# Patient Record
Sex: Female | Born: 1990 | Hispanic: No | Marital: Married | Smoking: Never smoker
Health system: Southern US, Community
[De-identification: ages and names within clinical notes are randomized; demographics above are authoritative.]

## PROBLEM LIST (undated history)

## (undated) ENCOUNTER — Inpatient Hospital Stay (HOSPITAL_COMMUNITY): Payer: Self-pay

## (undated) DIAGNOSIS — N921 Excessive and frequent menstruation with irregular cycle: Secondary | ICD-10-CM

## (undated) DIAGNOSIS — N939 Abnormal uterine and vaginal bleeding, unspecified: Secondary | ICD-10-CM

## (undated) DIAGNOSIS — K219 Gastro-esophageal reflux disease without esophagitis: Secondary | ICD-10-CM

## (undated) DIAGNOSIS — Z975 Presence of (intrauterine) contraceptive device: Secondary | ICD-10-CM

## (undated) HISTORY — PX: NO PAST SURGERIES: SHX2092

## (undated) HISTORY — DX: Presence of (intrauterine) contraceptive device: Z97.5

## (undated) HISTORY — DX: Abnormal uterine and vaginal bleeding, unspecified: N93.9

## (undated) HISTORY — DX: Excessive and frequent menstruation with irregular cycle: N92.1

---

## 2012-01-04 NOTE — L&D Delivery Note (Signed)
Delivery Note At 10:30 PM a viable female was delivered via Vaginal, Spontaneous Delivery (Presentation: Right Occiput Anterior).  APGAR: 9, 9; weight 7 lb 1.2 oz (3209 g).   Placenta status: Intact, Spontaneous.  Cord: 3 vessels with the following complications: None.    Anesthesia: Local  Episiotomy: None Lacerations: Vaginal Suture Repair: 3.0 vicryl- 1 stitch for hemostasis  Est. Blood Loss (mL): 300  Mom to postpartum.  Baby to nursery-stable.  Pt c/c/+2 and pushed to deliver a liveborn female with spontaneous cry as above. Placenta delivered spontaneously intact with a 3 vessel cord. No complications. A vaginal laceration with a small amount of bleeding that required a stitch placement for hemostasis.  No other tears. Mom and baby doing well.   Shea Kapur L 09/12/2012, 10:58 PM

## 2012-01-17 ENCOUNTER — Encounter (HOSPITAL_COMMUNITY): Payer: Self-pay | Admitting: *Deleted

## 2012-01-17 ENCOUNTER — Emergency Department (HOSPITAL_COMMUNITY)
Admission: EM | Admit: 2012-01-17 | Discharge: 2012-01-17 | Disposition: A | Discharge status: Home / Self Care | Payer: Medicaid Other | Attending: Emergency Medicine | Admitting: Emergency Medicine

## 2012-01-17 ENCOUNTER — Emergency Department (HOSPITAL_COMMUNITY): Payer: Medicaid Other

## 2012-01-17 ENCOUNTER — Other Ambulatory Visit: Payer: Self-pay

## 2012-01-17 DIAGNOSIS — N760 Acute vaginitis: Secondary | ICD-10-CM | POA: Insufficient documentation

## 2012-01-17 DIAGNOSIS — O200 Threatened abortion: Secondary | ICD-10-CM | POA: Insufficient documentation

## 2012-01-17 DIAGNOSIS — B9689 Other specified bacterial agents as the cause of diseases classified elsewhere: Secondary | ICD-10-CM

## 2012-01-17 DIAGNOSIS — O9989 Other specified diseases and conditions complicating pregnancy, childbirth and the puerperium: Secondary | ICD-10-CM | POA: Insufficient documentation

## 2012-01-17 DIAGNOSIS — R509 Fever, unspecified: Secondary | ICD-10-CM | POA: Insufficient documentation

## 2012-01-17 DIAGNOSIS — O2390 Unspecified genitourinary tract infection in pregnancy, unspecified trimester: Secondary | ICD-10-CM | POA: Insufficient documentation

## 2012-01-17 DIAGNOSIS — IMO0001 Reserved for inherently not codable concepts without codable children: Secondary | ICD-10-CM | POA: Insufficient documentation

## 2012-01-17 LAB — URINE MICROSCOPIC-ADD ON

## 2012-01-17 LAB — CBC WITH DIFFERENTIAL/PLATELET
Basophils Absolute: 0 10*3/uL (ref 0.0–0.1)
Basophils Relative: 1 % (ref 0–1)
Eosinophils Relative: 0 % (ref 0–5)
HCT: 34.6 % — ABNORMAL LOW (ref 36.0–46.0)
Lymphocytes Relative: 32 % (ref 12–46)
MCHC: 34.1 g/dL (ref 30.0–36.0)
MCV: 78.1 fL (ref 78.0–100.0)
Monocytes Absolute: 0.4 10*3/uL (ref 0.1–1.0)
Platelets: 230 10*3/uL (ref 150–400)
RDW: 13.8 % (ref 11.5–15.5)
WBC: 4.8 10*3/uL (ref 4.0–10.5)

## 2012-01-17 LAB — POCT PREGNANCY, URINE: Preg Test, Ur: POSITIVE — AB

## 2012-01-17 LAB — URINALYSIS, ROUTINE W REFLEX MICROSCOPIC
Bilirubin Urine: NEGATIVE
Glucose, UA: NEGATIVE mg/dL
Specific Gravity, Urine: 1.022 (ref 1.005–1.030)
Urobilinogen, UA: 0.2 mg/dL (ref 0.0–1.0)
pH: 6 (ref 5.0–8.0)

## 2012-01-17 LAB — HCG, QUANTITATIVE, PREGNANCY: hCG, Beta Chain, Quant, S: 23100 m[IU]/mL — ABNORMAL HIGH (ref <5)

## 2012-01-17 LAB — COMPREHENSIVE METABOLIC PANEL
ALT: 12 U/L (ref 0–35)
AST: 18 U/L (ref 0–37)
Albumin: 4 g/dL (ref 3.5–5.2)
Calcium: 9.6 mg/dL (ref 8.4–10.5)
Creatinine, Ser: 0.5 mg/dL (ref 0.50–1.10)
Sodium: 135 mEq/L (ref 135–145)
Total Protein: 7.8 g/dL (ref 6.0–8.3)

## 2012-01-17 LAB — WET PREP, GENITAL
Trich, Wet Prep: NONE SEEN
Yeast Wet Prep HPF POC: NONE SEEN

## 2012-01-17 MED ORDER — METRONIDAZOLE 500 MG PO TABS: 500.0000 mg | ORAL_TABLET | Freq: Two times a day (BID) | ORAL | Status: DC

## 2012-01-17 MED ORDER — RHO D IMMUNE GLOBULIN 1500 UNIT/2ML IJ SOLN
300.0000 ug | Freq: Once | INTRAMUSCULAR | Status: AC
  Administered 2012-01-17: 300 ug via INTRAMUSCULAR

## 2012-01-17 MED ORDER — PRENATAL COMPLETE 14-0.4 MG PO TABS: 1.0000 | ORAL_TABLET | Freq: Every day | ORAL | Status: DC

## 2012-01-17 NOTE — ED Notes (Signed)
Pt speaks arabic and per interpreter phone pt is here with body aches, fever, and concerned about her heart.  Pt denies chest pain or shortness of breath.

## 2012-01-17 NOTE — ED Notes (Signed)
Pt to [place]. Communicated to [place] in [place] pt will need translator service for communication.

## 2012-01-17 NOTE — ED Provider Notes (Signed)
Medical screening examination/treatment/procedure(s) were performed by non-physician practitioner and as supervising physician I was immediately available for consultation/collaboration.    Sherese Heyward R Logann Whitebread, MD 01/17/12 1616 

## 2012-01-17 NOTE — ED Notes (Signed)
Pt resting watching tv and family at bedside doing same

## 2012-01-17 NOTE — ED Notes (Signed)
NAD noted at time of d/c home 

## 2012-01-17 NOTE — ED Notes (Signed)
Pt husband at bedside. Husband speaks little [place]. Interpretor phones in Arabic will be used.

## 2012-01-17 NOTE — ED Provider Notes (Addendum)
History     CSN: [id number]  Arrival date & time 01/17/12  0917   First MD Initiated Contact with Patient 01/17/12 1053      No chief complaint on file.   (Consider location/radiation/quality/duration/timing/severity/associated sxs/prior treatment) HPI  Brooke Perry is a 22 y.o. female complaining of 7 days of subjective fever. Denies pharyngitis, cough, rhinnorea, N/V/D, no urinary signs and symptoms or vaginal discharge. Patient endorses a severe myalgia and lower midline abdominal pain.  LMP 11/26 G1P0   History reviewed. No pertinent past medical history.  History reviewed. No pertinent past surgical history.  No family history on file.  History  Substance Use Topics  . Smoking status: Not on file  . Smokeless tobacco: Not on file  . Alcohol Use: Not on file    OB History    Grav Para Term Preterm Abortions TAB SAB Ect Mult Living                  Review of Systems  Constitutional: Positive for fever.  Respiratory: Negative for shortness of breath.   Cardiovascular: Negative for chest pain.  Gastrointestinal: Positive for abdominal pain. Negative for nausea, vomiting and diarrhea.  Musculoskeletal: Positive for myalgias.  All other systems reviewed and are negative.    Allergies  Review of patient's allergies indicates no known allergies.  Home Medications  No current outpatient prescriptions on file.  BP 115/58  Pulse 86  Temp 98.3 F (36.8 C) (Oral)  Resp 18  SpO2 100%  LMP 11/29/2011  Physical Exam  Nursing note and vitals reviewed. Constitutional: She is oriented to person, place, and time. She appears well-developed and well-nourished. No distress.  HENT:  Head: Normocephalic and atraumatic.  Mouth/Throat: Oropharynx is clear and moist.  Eyes: Conjunctivae normal and EOM are normal. Pupils are equal, round, and reactive to light.  Neck: Normal range of motion.  Cardiovascular: Normal rate, regular rhythm and intact distal pulses.     Pulmonary/Chest: Effort normal. No stridor.  Abdominal: Soft. Bowel sounds are normal. She exhibits no distension and no mass. There is tenderness. There is no rebound and no guarding.       Tenderness to deep palpation below the umbilicus  Genitourinary: Cervix exhibits no motion tenderness. Right adnexum displays no tenderness. Left adnexum displays no tenderness.       Pelvic exam chaperoned by technician. Dark blood pooled in the posterior fourchette.   No cervical motion or adnexal tenderness  Musculoskeletal: Normal range of motion.  Neurological: She is alert and oriented to person, place, and time.  Psychiatric: She has a normal mood and affect.    ED Course  Procedures (including critical care time)  Labs Reviewed  CBC WITH DIFFERENTIAL - Abnormal; Notable for the following:    Hemoglobin 11.8 (*)     HCT 34.6 (*)     All other components within normal limits  COMPREHENSIVE METABOLIC PANEL - Abnormal; Notable for the following:    Total Bilirubin 0.2 (*)     All other components within normal limits  URINALYSIS, ROUTINE W REFLEX MICROSCOPIC - Abnormal; Notable for the following:    Hgb urine dipstick TRACE (*)     Ketones, ur >80 (*)     All other components within normal limits  POCT PREGNANCY, URINE - Abnormal; Notable for the following:    Preg Test, Ur POSITIVE (*)     All other components within normal limits  URINE MICROSCOPIC-ADD ON - Abnormal; Notable for the following:  Squamous Epithelial / LPF FEW (*)     All other components within normal limits  HCG, QUANTITATIVE, PREGNANCY - Abnormal; Notable for the following:    hCG, Beta Chain, Quant, S 23100 (*)     All other components within normal limits  WET PREP, GENITAL - Abnormal; Notable for the following:    Clue Cells Wet Prep HPF POC FEW (*)     WBC, Wet Prep HPF POC FEW (*)     All other components within normal limits  INFLUENZA PANEL BY PCR  RH IG WORKUP (INCLUDES ABO/RH)  GC/CHLAMYDIA PROBE AMP    US Ob Comp Less 14 Wks  01/17/2012  *RADIOLOGY REPORT*  Clinical Data: abd pain vaginal bleed,bleeding; ;  OBSTETRIC <14 WK [place] AND TRANSVAGINAL OB US  Technique: Both transabdominal and transvaginal ultrasound examinations were performed for complete evaluation of the gestation as well as the maternal uterus, adnexal regions, and pelvic cul-de-sac.  Comparison: None.  Findings: There is a single intrauterine pregnancy.  Crown-rump length is 3.8 cm for an estimated gestational age of [redacted] weeks 0-day. Fetal heart tones not definitively documented at this time. Recommend follow-up.  No subchorionic hemorrhage.  Ovaries are symmetric in size and echotexture.  No adnexal masses. Trace free fluid adjacent to the left ovary.  IMPRESSION: 6-week-0-day intrauterine pregnancy.  No definite fetal heart tones currently (likely too early to visualize).  Recommend follow-up ultrasound in 10-14 days.   Original Report Authenticated By: Charlett Nose, M.D.    US Ob Transvaginal  01/17/2012  *RADIOLOGY REPORT*  Clinical Data: abd pain vaginal bleed,bleeding; ;  OBSTETRIC <14 WK [place] AND TRANSVAGINAL OB US  Technique: Both transabdominal and transvaginal ultrasound examinations were performed for complete evaluation of the gestation as well as the maternal uterus, adnexal regions, and pelvic cul-de-sac.  Comparison: None.  Findings: There is a single intrauterine pregnancy.  Crown-rump length is 3.8 cm for an estimated gestational age of [redacted] weeks 0-day. Fetal heart tones not definitively documented at this time. Recommend follow-up.  No subchorionic hemorrhage.  Ovaries are symmetric in size and echotexture.  No adnexal masses. Trace free fluid adjacent to the left ovary.  IMPRESSION: 6-week-0-day intrauterine pregnancy.  No definite fetal heart tones currently (likely too early to visualize).  Recommend follow-up ultrasound in 10-14 days.   Original Report Authenticated By: Charlett Nose, M.D.      1. Threatened abortion   2. BV  (bacterial vaginosis)       MDM   Patient is sudanese requires a sudanese dialect aerobic interpreter. Patient with abdominal pain, myalgia and fever for 7 days for newly diagnosed pregnancy. This is her first pregnancy. Pelvic exam shows blood in the posterior fourchette. She is Rh-, patient given 300 mcg of RhoGAM. Transvaginal ultrasound ordered to rule out ectopic pregnancy.  Case signed out to PA Muthersbaugh at shift change. Plan is to DC her to home with [facility] if TVUS is negative.       Wynetta Emery, PA-C 01/17/12 1554  Transvaginal ultrasound shows intrauterine pregnancy at 6 weeks. No fetal heart tones. Patient has clue cells on her wet prep. I will treat her for bacterial vaginosis.  [facility] for a OB/GYN exam.  New Prescriptions   METRONIDAZOLE (FLAGYL) 500 MG TABLET    Take 1 tablet (500 mg total) by mouth 2 (two) times daily.   PRENATAL VIT-FE FUMARATE-FA (PRENATAL COMPLETE) 14-0.4 MG  TABS    Take 1 tablet by mouth daily.      Wynetta Emery, PA-C 01/17/12 1619  Wynetta Emery, PA-C 01/18/12 [phone]

## 2012-01-18 ENCOUNTER — Telehealth: Payer: Self-pay | Admitting: Family Medicine

## 2012-01-18 DIAGNOSIS — O26859 Spotting complicating pregnancy, unspecified trimester: Secondary | ICD-10-CM

## 2012-01-18 LAB — RH IG WORKUP (INCLUDES ABO/RH): Gestational Age(Wks): 6

## 2012-01-19 NOTE — Telephone Encounter (Signed)
Orders only

## 2012-01-19 NOTE — ED Provider Notes (Signed)
Medical screening examination/treatment/procedure(s) were performed by non-physician practitioner and as supervising physician I was immediately available for consultation/collaboration.  Celene Kras, MD 01/19/12 1021

## 2012-01-31 ENCOUNTER — Ambulatory Visit (HOSPITAL_COMMUNITY)
Admission: RE | Admit: 2012-01-31 | Discharge: 2012-01-31 | Disposition: A | Discharge status: Home / Self Care | Payer: Medicaid Other | Source: Ambulatory Visit | Attending: Family Medicine | Admitting: Family Medicine

## 2012-01-31 DIAGNOSIS — O3680X0 Pregnancy with inconclusive fetal viability, not applicable or unspecified: Secondary | ICD-10-CM | POA: Insufficient documentation

## 2012-01-31 DIAGNOSIS — Z3689 Encounter for other specified antenatal screening: Secondary | ICD-10-CM | POA: Insufficient documentation

## 2012-01-31 DIAGNOSIS — O26859 Spotting complicating pregnancy, unspecified trimester: Secondary | ICD-10-CM

## 2012-02-29 ENCOUNTER — Other Ambulatory Visit (HOSPITAL_COMMUNITY)
Admission: RE | Admit: 2012-02-29 | Discharge: 2012-02-29 | Disposition: A | Discharge status: Home / Self Care | Payer: Medicaid Other | Source: Ambulatory Visit | Attending: Family Medicine | Admitting: Family Medicine

## 2012-02-29 ENCOUNTER — Other Ambulatory Visit: Payer: Self-pay | Admitting: Family Medicine

## 2012-02-29 ENCOUNTER — Encounter: Payer: Self-pay | Admitting: Family Medicine

## 2012-02-29 ENCOUNTER — Ambulatory Visit (INDEPENDENT_AMBULATORY_CARE_PROVIDER_SITE_OTHER): Payer: Medicaid Other | Admitting: Family Medicine

## 2012-02-29 VITALS — BP 127/72 | Ht 61.0 in | Wt 83.8 lb

## 2012-02-29 DIAGNOSIS — Z3682 Encounter for antenatal screening for nuchal translucency: Secondary | ICD-10-CM

## 2012-02-29 DIAGNOSIS — Z3400 Encounter for supervision of normal first pregnancy, unspecified trimester: Secondary | ICD-10-CM

## 2012-02-29 DIAGNOSIS — Z3402 Encounter for supervision of normal first pregnancy, second trimester: Secondary | ICD-10-CM | POA: Insufficient documentation

## 2012-02-29 DIAGNOSIS — Z01419 Encounter for gynecological examination (general) (routine) without abnormal findings: Secondary | ICD-10-CM | POA: Insufficient documentation

## 2012-02-29 DIAGNOSIS — Z3401 Encounter for supervision of normal first pregnancy, first trimester: Secondary | ICD-10-CM

## 2012-02-29 DIAGNOSIS — Z113 Encounter for screening for infections with a predominantly sexual mode of transmission: Secondary | ICD-10-CM | POA: Insufficient documentation

## 2012-02-29 LAB — HEPATITIS B SURFACE ANTIGEN: Hepatitis B Surface Ag: NEGATIVE

## 2012-02-29 LAB — POCT URINALYSIS DIP (DEVICE)
Glucose, UA: NEGATIVE mg/dL
Ketones, ur: NEGATIVE mg/dL
Leukocytes, UA: NEGATIVE
Protein, ur: 30 mg/dL — AB
Urobilinogen, UA: 0.2 mg/dL (ref 0.0–1.0)

## 2012-02-29 LAB — OB RESULTS CONSOLE RUBELLA ANTIBODY, IGM: Rubella: NON-IMMUNE/NOT IMMUNE

## 2012-02-29 MED ORDER — ONDANSETRON 4 MG PO TBDP: 4.0000 mg | ORAL_TABLET | Freq: Four times a day (QID) | ORAL | Status: DC | PRN

## 2012-02-29 NOTE — Patient Instructions (Signed)
Pregnancy - Second Trimester The second trimester of pregnancy (3 to 6 months) is a period of rapid growth for you and your baby. At the end of the sixth month, your baby is about 9 inches long and weighs 1 1/2 pounds. You will begin to feel the baby move between 18 and 20 weeks of the pregnancy. This is called quickening. Weight gain is faster. A clear fluid (colostrum) may leak out of your breasts. You may feel small contractions of the womb (uterus). This is known as false labor or Braxton-Hicks contractions. This is like a practice for labor when the baby is ready to be born. Usually, the problems with morning sickness have usually passed by the end of your first trimester. Some women develop small dark blotches (called cholasma, mask of pregnancy) on their face that usually goes away after the baby is born. Exposure to the sun makes the blotches worse. Acne may also develop in some pregnant women and pregnant women who have acne, may find that it goes away. PRENATAL EXAMS  Blood work may continue to be done during prenatal exams. These tests are done to check on your health and the probable health of your baby. Blood work is used to follow your blood levels (hemoglobin). Anemia (low hemoglobin) is common during pregnancy. Iron and vitamins are given to help prevent this. You will also be checked for diabetes between 24 and 28 weeks of the pregnancy. Some of the previous blood tests may be repeated.  The size of the uterus is measured during each visit. This is to make sure that the baby is continuing to grow properly according to the dates of the pregnancy.  Your blood pressure is checked every prenatal visit. This is to make sure you are not getting toxemia.  Your urine is checked to make sure you do not have an infection, diabetes or protein in the urine.  Your weight is checked often to make sure gains are happening at the suggested rate. This is to ensure that both you and your baby are  growing normally.  Sometimes, an ultrasound is performed to confirm the proper growth and development of the baby. This is a test which bounces harmless sound waves off the baby so your caregiver can more accurately determine due dates. Sometimes, a specialized test is done on the amniotic fluid surrounding the baby. This test is called an amniocentesis. The amniotic fluid is obtained by sticking a needle into the belly (abdomen). This is done to check the chromosomes in instances where there is a concern about possible genetic problems with the baby. It is also sometimes done near the end of pregnancy if an early delivery is required. In this case, it is done to help make sure the baby's lungs are mature enough for the baby to live outside of the womb. CHANGES OCCURING IN THE SECOND TRIMESTER OF PREGNANCY Your body goes through many changes during pregnancy. They vary from person to person. Talk to your caregiver about changes you notice that you are concerned about.  During the second trimester, you will likely have an increase in your appetite. It is normal to have cravings for certain foods. This varies from person to person and pregnancy to pregnancy.  Your lower abdomen will begin to bulge.  You may have to urinate more often because the uterus and baby are pressing on your bladder. It is also common to get more bladder infections during pregnancy (pain with urination). You can help this by   drinking lots of fluids and emptying your bladder before and after intercourse.  You may begin to get stretch marks on your hips, abdomen, and breasts. These are normal changes in the body during pregnancy. There are no exercises or medications to take that prevent this change.  You may begin to develop swollen and bulging veins (varicose veins) in your legs. Wearing support hose, elevating your feet for 15 minutes, 3 to 4 times a day and limiting salt in your diet helps lessen the problem.  Heartburn may  develop as the uterus grows and pushes up against the stomach. Antacids recommended by your caregiver helps with this problem. Also, eating smaller meals 4 to 5 times a day helps.  Constipation can be treated with a stool softener or adding bulk to your diet. Drinking lots of fluids, vegetables, fruits, and whole grains are helpful.  Exercising is also helpful. If you have been very active up until your pregnancy, most of these activities can be continued during your pregnancy. If you have been less active, it is helpful to start an exercise program such as walking.  Hemorrhoids (varicose veins in the rectum) may develop at the end of the second trimester. Warm sitz baths and hemorrhoid cream recommended by your caregiver helps hemorrhoid problems.  Backaches may develop during this time of your pregnancy. Avoid heavy lifting, wear low heal shoes and practice good posture to help with backache problems.  Some pregnant women develop tingling and numbness of their hand and fingers because of swelling and tightening of ligaments in the wrist (carpel tunnel syndrome). This goes away after the baby is born.  As your breasts enlarge, you may have to get a bigger bra. Get a comfortable, cotton, support bra. Do not get a nursing bra until the last month of the pregnancy if you will be nursing the baby.  You may get a dark line from your belly button to the pubic area called the linea nigra.  You may develop rosy cheeks because of increase blood flow to the face.  You may develop spider looking lines of the face, neck, arms and chest. These go away after the baby is born. HOME CARE INSTRUCTIONS   It is extremely important to avoid all smoking, herbs, alcohol, and unprescribed drugs during your pregnancy. These chemicals affect the formation and growth of the baby. Avoid these chemicals throughout the pregnancy to ensure the delivery of a healthy infant.  Most of your home care instructions are the same  as suggested for the first trimester of your pregnancy. Keep your caregiver's appointments. Follow your caregiver's instructions regarding medication use, exercise and diet.  During pregnancy, you are providing food for you and your baby. Continue to eat regular, well-balanced meals. Choose foods such as meat, fish, milk and other low fat dairy products, vegetables, fruits, and whole-grain breads and cereals. Your caregiver will tell you of the ideal weight gain.  A physical sexual relationship may be continued up until near the end of pregnancy if there are no other problems. Problems could include early (premature) leaking of amniotic fluid from the membranes, vaginal bleeding, abdominal pain, or other medical or pregnancy problems.  Exercise regularly if there are no restrictions. Check with your caregiver if you are unsure of the safety of some of your exercises. The greatest weight gain will occur in the last 2 trimesters of pregnancy. Exercise will help you:  Control your weight.  Get you in shape for labor and delivery.  Lose weight   after you have the baby.  Wear a good support or jogging bra for breast tenderness during pregnancy. This may help if worn during sleep. Pads or tissues may be used in the bra if you are leaking colostrum.  Do not use hot tubs, steam rooms or saunas throughout the pregnancy.  Wear your seat belt at all times when driving. This protects you and your baby if you are in an accident.  Avoid raw meat, uncooked cheese, cat litter boxes and soil used by cats. These carry germs that can cause birth defects in the baby.  The second trimester is also a good time to visit your dentist for your dental health if this has not been done yet. Getting your teeth cleaned is OK. Use a soft toothbrush. Brush gently during pregnancy.  It is easier to loose urine during pregnancy. Tightening up and strengthening the pelvic muscles will help with this problem. Practice stopping  your urination while you are going to the bathroom. These are the same muscles you need to strengthen. It is also the muscles you would use as if you were trying to stop from passing gas. You can practice tightening these muscles up 10 times a set and repeating this about 3 times per day. Once you know what muscles to tighten up, do not perform these exercises during urination. It is more likely to contribute to an infection by backing up the urine.  Ask for help if you have financial, counseling or nutritional needs during pregnancy. Your caregiver will be able to offer counseling for these needs as well as refer you for other special needs.  Your skin may become oily. If so, wash your face with mild soap, use non-greasy moisturizer and oil or cream based makeup. MEDICATIONS AND DRUG USE IN PREGNANCY  Take prenatal vitamins as directed. The vitamin should contain 1 milligram of folic acid. Keep all vitamins out of reach of children. Only a couple vitamins or tablets containing iron may be fatal to a baby or young child when ingested.  Avoid use of all medications, including herbs, over-the-counter medications, not prescribed or suggested by your caregiver. Only take over-the-counter or prescription medicines for pain, discomfort, or fever as directed by your caregiver. Do not use aspirin.  Let your caregiver also know about herbs you may be using.  Alcohol is related to a number of birth defects. This includes fetal alcohol syndrome. All alcohol, in any form, should be avoided completely. Smoking will cause low birth rate and premature babies.  Street or illegal drugs are very harmful to the baby. They are absolutely forbidden. A baby born to an addicted mother will be addicted at birth. The baby will go through the same withdrawal an adult does. SEEK MEDICAL CARE IF:  You have any concerns or worries during your pregnancy. It is better to call with your questions if you feel they cannot wait,  rather than worry about them. SEEK IMMEDIATE MEDICAL CARE IF:   An unexplained oral temperature above 102 F (38.9 C) develops, or as your caregiver suggests.  You have leaking of fluid from the vagina (birth canal). If leaking membranes are suspected, take your temperature and tell your caregiver of this when you call.  There is vaginal spotting, bleeding, or passing clots. Tell your caregiver of the amount and how many pads are used. Light spotting in pregnancy is common, especially following intercourse.  You develop a bad smelling vaginal discharge with a change in the color from clear   to white.  You continue to feel sick to your stomach (nauseated) and have no relief from remedies suggested. You vomit blood or coffee ground-like materials.  You lose more than 2 pounds of weight or gain more than 2 pounds of weight over 1 week, or as suggested by your caregiver.  You notice swelling of your face, hands, feet, or legs.  You get exposed to German measles and have never had them.  You are exposed to fifth disease or chickenpox.  You develop belly (abdominal) pain. Round ligament discomfort is a common non-cancerous (benign) cause of abdominal pain in pregnancy. Your caregiver still must evaluate you.  You develop a bad headache that does not go away.  You develop fever, diarrhea, pain with urination, or shortness of breath.  You develop visual problems, blurry, or double vision.  You fall or are in a car accident or any kind of trauma.  There is mental or physical violence at home. Document Released: 12/14/2000 Document Revised: 03/14/2011 Document Reviewed: 06/18/2008 [employer] Patient Information 2013 [employer].  Breastfeeding Deciding to breastfeed is one of the best choices you can make for you and your baby. The information that follows gives a brief overview of the benefits of breastfeeding as well as common topics surrounding breastfeeding. BENEFITS OF  BREASTFEEDING For the baby  The first milk (colostrum) helps the baby's digestive system function better.   There are antibodies in the mother's milk that help the baby fight off infections.   The baby has a lower incidence of asthma, allergies, and sudden infant death syndrome (SIDS).   The nutrients in breast milk are better for the baby than infant formulas, and breast milk helps the baby's brain grow better.   Babies who breastfeed have less gas, colic, and constipation.  For the mother  Breastfeeding helps develop a very special bond between the mother and her baby.   Breastfeeding is convenient, always available at the correct temperature, and costs nothing.   Breastfeeding burns calories in the mother and helps her lose weight that was gained during pregnancy.   Breastfeeding makes the uterus contract back down to normal size faster and slows bleeding following delivery.   Breastfeeding mothers have a lower risk of developing breast cancer.  BREASTFEEDING FREQUENCY  A healthy, full-term baby may breastfeed as often as every hour or space his or her feedings to every 3 hours.   Watch your baby for signs of hunger. Nurse your baby if he or she shows signs of hunger. How often you nurse will vary from baby to baby.   Nurse as often as the baby requests, or when you feel the need to reduce the fullness of your breasts.   Awaken the baby if it has been 3 4 hours since the last feeding.   Frequent feeding will help the mother make more milk and will help prevent problems, such as sore nipples and engorgement of the breasts.  BABY'S POSITION AT THE BREAST  Whether lying down or sitting, be sure that the baby's tummy is facing your tummy.   Support the breast with 4 fingers underneath the breast and the thumb above. Make sure your fingers are well away from the nipple and baby's mouth.   Stroke the baby's lips gently with your finger or nipple.   When the  baby's mouth is open wide enough, place all of your nipple and as much of the areola as possible into your baby's mouth.   Pull the baby in   close so the tip of the nose and the baby's cheeks touch the breast during the feeding.  FEEDINGS AND SUCTION  The length of each feeding varies from baby to baby and from feeding to feeding.   The baby must suck about 2 3 minutes for your milk to get to him or her. This is called a "let down." For this reason, allow the baby to feed on each breast as long as he or she wants. Your baby will end the feeding when he or she has received the right balance of nutrients.   To break the suction, put your finger into the corner of the baby's mouth and slide it between his or her gums before removing your breast from his or her mouth. This will help prevent sore nipples.  HOW TO TELL WHETHER YOUR BABY IS GETTING ENOUGH BREAST MILK. Wondering whether or not your baby is getting enough milk is a common concern among mothers. You can be assured that your baby is getting enough milk if:   Your baby is actively sucking and you hear swallowing.   Your baby seems relaxed and satisfied after a feeding.   Your baby nurses at least 8 12 times in a 24 hour time period. Nurse your baby until he or she unlatches or falls asleep at the first breast (at least 10 20 minutes), then offer the second side.   Your baby is wetting 5 6 disposable diapers (6 8 cloth diapers) in a 24 hour period by 5 6 days of age.   Your baby is having at least 3 4 stools every 24 hours for the first 6 weeks. The stool should be soft and yellow.   Your baby should gain 4 7 ounces per week after he or she is 4 days old.   Your breasts feel softer after nursing.  REDUCING BREAST ENGORGEMENT  In the first week after your baby is born, you may experience signs of breast engorgement. When breasts are engorged, they feel heavy, warm, full, and may be tender to the touch. You can reduce  engorgement if you:   Nurse frequently, every 2 3 hours. Mothers who breastfeed early and often have fewer problems with engorgement.   Place light ice packs on your breasts for 10 20 minutes between feedings. This reduces swelling. Wrap the ice packs in a lightweight towel to protect your skin. Bags of frozen vegetables work well for this purpose.   Take a warm shower or apply warm, moist heat to your breast for 5 10 minutes just before each feeding. This increases circulation and helps the milk flow.   Gently massage your breast before and during the feeding. Using your finger tips, massage from the chest wall towards your nipple in a circular motion.   Make sure that the baby empties at least one breast at every feeding before switching sides.   Use a breast pump to empty the breasts if your baby is sleepy or not nursing well. You may also want to pump if you are returning to work oryou feel you are getting engorged.   Avoid bottle feeds, pacifiers, or supplemental feedings of water or juice in place of breastfeeding. Breast milk is all the food your baby needs. It is not necessary for your baby to have water or formula. In fact, to help your breasts make more milk, it is best not to give your baby supplemental feedings during the early weeks.   Be sure the baby is latched   on and positioned properly while breastfeeding.   Wear a supportive bra, avoiding underwire styles.   Eat a balanced diet with enough fluids.   Rest often, relax, and take your prenatal vitamins to prevent fatigue, stress, and anemia.  If you follow these suggestions, your engorgement should improve in 24 48 hours. If you are still experiencing difficulty, call your lactation consultant or caregiver.  CARING FOR YOURSELF Take care of your breasts  Bathe or shower daily.   Avoid using soap on your nipples.   Start feedings on your left breast at one feeding and on your right breast at the next  feeding.   You will notice an increase in your milk supply 2 5 days after delivery. You may feel some discomfort from engorgement, which makes your breasts very firm and often tender. Engorgement "peaks" out within 24 48 hours. In the meantime, apply warm moist towels to your breasts for 5 10 minutes before feeding. Gentle massage and expression of some milk before feeding will soften your breasts, making it easier for your baby to latch on.   Wear a well-fitting nursing bra, and air dry your nipples for a 3 4minutes after each feeding.   Only use cotton bra pads.   Only use pure lanolin on your nipples after nursing. You do not need to wash it off before feeding the baby again. Another option is to express a few drops of breast milk and gently massage it into your nipples.  Take care of yourself  Eat well-balanced meals and nutritious snacks.   Drinking milk, fruit juice, and water to satisfy your thirst (about 8 glasses a day).   Get plenty of rest.  Avoid foods that you notice affect the baby in a bad way.  SEEK MEDICAL CARE IF:   You have difficulty with breastfeeding and need help.   You have a hard, red, sore area on your breast that is accompanied by a fever.   Your baby is too sleepy to eat well or is having trouble sleeping.   Your baby is wetting less than 6 diapers a day, by 5 days of age.   Your baby's skin or white part of his or her eyes is more yellow than it was in the hospital.   You feel depressed.  Document Released: 12/20/2004 Document Revised: 06/21/2011 Document Reviewed: 03/20/2011 [employer] Patient Information 2013 [employer].  

## 2012-02-29 NOTE — Progress Notes (Signed)
Subjective:    Brooke Perry is being seen today for her first obstetrical visit.  This is not a planned pregnancy. She is at [redacted]w[redacted]d gestation by 7w ultrasound (not c/w LMP). Her obstetrical history is significant for none. Relationship with FOB: spouse, living together. Patient does intend to breast feed. Pregnancy history fully reviewed. She and her husband moved here from [place] 4 months ago. Exam and interview conducted with Arabic translator in room.  Pt has episode of bleeding earlier in pregnancy which has resolved. She received a dose of Rhogam at that time. Her blood type is AB negative. She reports no bleeding, only mild lower abdominal cramping occasionally. No vaginal discharge. No history of STDs or HSV.   Also c/o nausea and vomiting.   Menstrual History: OB History   Grav Para Term Preterm Abortions TAB SAB Ect Mult Living   1               Patient's last menstrual period was 11/29/2011.   The following portions of the patient's history were reviewed and updated as appropriate: allergies, current medications, past family history, past medical history, past social history, past surgical history and problem list.  Review of Systems Constitutional: negative for chills, fevers and sweats Eyes: negative for color blindness and visual disturbance Respiratory: negative for asthma, cough, dyspnea on exertion and wheezing Cardiovascular: negative for chest pain, dyspnea, irregular heart beat and palpitations Gastrointestinal: negative for constipation and diarrhea Genitourinary:negative for dysuria Neurological: positive for headaches, negative for dizziness    Objective:    BP 127/72  Ht 5\' 1"  (1.549 m)  Wt 83 lb 12.8 oz (38.011 kg)  BMI 15.84 kg/m2  LMP 11/29/2011  General Appearance:    Alert, cooperative, no distress, appears stated age  Head:    Normocephalic, without obvious abnormality, atraumatic  Eyes:    Conjunctiva/corneas clear, EOM's intact  Neck:   Supple,  symmetrical, trachea midline, no adenopathy;    thyroid:  no enlargement/tenderness/nodules  Lungs:     Clear to auscultation bilaterally, respirations unlabored   Heart:    Regular rate and rhythm, S1 and S2 normal, no murmur, rub   or gallop  Breast Exam:    No tenderness, masses, or nipple abnormality  Abdomen:     Soft, non-tender, bowel sounds active, no guarding or rebound  Pelvic:   No lesions, no CMT, normal cervix, close/long. No adnexal tenderness. Normal vagina, moderate white discharge.  Extremities:   Extremities normal, atraumatic, no cyanosis or edema  Skin:   Skin color, texture, turgor normal, no rashes or lesions  Neurologic:   No focal deficits    Assessment:    Pregnancy at 12 and 0/7 weeks    Plan:    Initial labs drawn. Prenatal vitamins. Zofran for nausea. Problem list reviewed and updated. AFP3 discussed: requested. Referred to MFM for NT and 1st trimester screen. Role of ultrasound in pregnancy discussed; fetal survey: requested. Amniocentesis discussed: not indicated. Follow up in 4 weeks. 50% of 45 min visit spent on counseling and coordination of care.

## 2012-02-29 NOTE — Progress Notes (Signed)
Pulse 109 Has some pelvic pain at times

## 2012-03-02 LAB — HEMOGLOBINOPATHY EVALUATION
Hgb A: 97.1 % (ref 96.8–97.8)
Hgb F Quant: 0.3 % (ref 0.0–2.0)
Hgb S Quant: 0 %

## 2012-03-03 LAB — RUBELLA ANTIBODY, IGM: Rubella IgM: 0.37 (ref <0.90)

## 2012-03-04 ENCOUNTER — Encounter: Payer: Self-pay | Admitting: Family Medicine

## 2012-03-04 DIAGNOSIS — O9989 Other specified diseases and conditions complicating pregnancy, childbirth and the puerperium: Secondary | ICD-10-CM

## 2012-03-04 DIAGNOSIS — Z283 Underimmunization status: Secondary | ICD-10-CM | POA: Insufficient documentation

## 2012-03-05 ENCOUNTER — Encounter (HOSPITAL_COMMUNITY): Payer: Self-pay

## 2012-03-05 ENCOUNTER — Other Ambulatory Visit: Payer: Self-pay

## 2012-03-05 ENCOUNTER — Ambulatory Visit (HOSPITAL_COMMUNITY)
Admission: RE | Admit: 2012-03-05 | Discharge: 2012-03-05 | Disposition: A | Discharge status: Home / Self Care | Payer: Medicaid Other | Source: Ambulatory Visit | Attending: Family Medicine | Admitting: Family Medicine

## 2012-03-05 DIAGNOSIS — O3510X0 Maternal care for (suspected) chromosomal abnormality in fetus, unspecified, not applicable or unspecified: Secondary | ICD-10-CM | POA: Insufficient documentation

## 2012-03-05 DIAGNOSIS — Z3682 Encounter for antenatal screening for nuchal translucency: Secondary | ICD-10-CM

## 2012-03-05 DIAGNOSIS — Z3689 Encounter for other specified antenatal screening: Secondary | ICD-10-CM | POA: Insufficient documentation

## 2012-03-05 DIAGNOSIS — O351XX0 Maternal care for (suspected) chromosomal abnormality in fetus, not applicable or unspecified: Secondary | ICD-10-CM | POA: Insufficient documentation

## 2012-03-05 DIAGNOSIS — O360990 Maternal care for other rhesus isoimmunization, unspecified trimester, not applicable or unspecified: Secondary | ICD-10-CM | POA: Insufficient documentation

## 2012-03-07 ENCOUNTER — Encounter: Payer: Self-pay | Admitting: Family Medicine

## 2012-03-28 ENCOUNTER — Ambulatory Visit (INDEPENDENT_AMBULATORY_CARE_PROVIDER_SITE_OTHER): Payer: Medicaid Other | Admitting: Advanced Practice Midwife

## 2012-03-28 ENCOUNTER — Other Ambulatory Visit: Payer: Self-pay | Admitting: Advanced Practice Midwife

## 2012-03-28 ENCOUNTER — Encounter: Payer: Self-pay | Admitting: Advanced Practice Midwife

## 2012-03-28 VITALS — BP 107/72 | Temp 98.0°F | Wt 89.0 lb

## 2012-03-28 DIAGNOSIS — Z6791 Unspecified blood type, Rh negative: Secondary | ICD-10-CM | POA: Insufficient documentation

## 2012-03-28 DIAGNOSIS — Z3402 Encounter for supervision of normal first pregnancy, second trimester: Secondary | ICD-10-CM

## 2012-03-28 DIAGNOSIS — O360111 Maternal care for anti-D [Rh] antibodies, first trimester, fetus 1: Secondary | ICD-10-CM

## 2012-03-28 DIAGNOSIS — O9989 Other specified diseases and conditions complicating pregnancy, childbirth and the puerperium: Secondary | ICD-10-CM

## 2012-03-28 DIAGNOSIS — O360990 Maternal care for other rhesus isoimmunization, unspecified trimester, not applicable or unspecified: Secondary | ICD-10-CM

## 2012-03-28 DIAGNOSIS — Z2839 Other underimmunization status: Secondary | ICD-10-CM

## 2012-03-28 DIAGNOSIS — Z283 Underimmunization status: Secondary | ICD-10-CM

## 2012-03-28 LAB — POCT URINALYSIS DIP (DEVICE)
Hgb urine dipstick: NEGATIVE
Nitrite: NEGATIVE
pH: 6 (ref 5.0–8.0)

## 2012-03-28 NOTE — Patient Instructions (Addendum)
Pregnancy - Second Trimester The second trimester of pregnancy (3 to 6 months) is a period of rapid growth for you and your baby. At the end of the sixth month, your baby is about 9 inches long and weighs 1 1/2 pounds. You will begin to feel the baby move between 18 and 20 weeks of the pregnancy. This is called quickening. Weight gain is faster. A clear fluid (colostrum) may leak out of your breasts. You may feel small contractions of the womb (uterus). This is known as false labor or Braxton-Hicks contractions. This is like a practice for labor when the baby is ready to be born. Usually, the problems with morning sickness have usually passed by the end of your first trimester. Some women develop small dark blotches (called cholasma, mask of pregnancy) on their face that usually goes away after the baby is born. Exposure to the sun makes the blotches worse. Acne may also develop in some pregnant women and pregnant women who have acne, may find that it goes away. PRENATAL EXAMS  Blood work may continue to be done during prenatal exams. These tests are done to check on your health and the probable health of your baby. Blood work is used to follow your blood levels (hemoglobin). Anemia (low hemoglobin) is common during pregnancy. Iron and vitamins are given to help prevent this. You will also be checked for diabetes between 24 and 28 weeks of the pregnancy. Some of the previous blood tests may be repeated.  The size of the uterus is measured during each visit. This is to make sure that the baby is continuing to grow properly according to the dates of the pregnancy.  Your blood pressure is checked every prenatal visit. This is to make sure you are not getting toxemia.  Your urine is checked to make sure you do not have an infection, diabetes or protein in the urine.  Your weight is checked often to make sure gains are happening at the suggested rate. This is to ensure that both you and your baby are growing  normally.  Sometimes, an ultrasound is performed to confirm the proper growth and development of the baby. This is a test which bounces harmless sound waves off the baby so your caregiver can more accurately determine due dates. Sometimes, a specialized test is done on the amniotic fluid surrounding the baby. This test is called an amniocentesis. The amniotic fluid is obtained by sticking a needle into the belly (abdomen). This is done to check the chromosomes in instances where there is a concern about possible genetic problems with the baby. It is also sometimes done near the end of pregnancy if an early delivery is required. In this case, it is done to help make sure the baby's lungs are mature enough for the baby to live outside of the womb. CHANGES OCCURING IN THE SECOND TRIMESTER OF PREGNANCY Your body goes through many changes during pregnancy. They vary from person to person. Talk to your caregiver about changes you notice that you are concerned about.  During the second trimester, you will likely have an increase in your appetite. It is normal to have cravings for certain foods. This varies from person to person and pregnancy to pregnancy.  Your lower abdomen will begin to bulge.  You may have to urinate more often because the uterus and baby are pressing on your bladder. It is also common to get more bladder infections during pregnancy (pain with urination). You can help this by   drinking lots of fluids and emptying your bladder before and after intercourse.  You may begin to get stretch marks on your hips, abdomen, and breasts. These are normal changes in the body during pregnancy. There are no exercises or medications to take that prevent this change.  You may begin to develop swollen and bulging veins (varicose veins) in your legs. Wearing support hose, elevating your feet for 15 minutes, 3 to 4 times a day and limiting salt in your diet helps lessen the problem.  Heartburn may develop  as the uterus grows and pushes up against the stomach. Antacids recommended by your caregiver helps with this problem. Also, eating smaller meals 4 to 5 times a day helps.  Constipation can be treated with a stool softener or adding bulk to your diet. Drinking lots of fluids, vegetables, fruits, and whole grains are helpful.  Exercising is also helpful. If you have been very active up until your pregnancy, most of these activities can be continued during your pregnancy. If you have been less active, it is helpful to start an exercise program such as walking.  Hemorrhoids (varicose veins in the rectum) may develop at the end of the second trimester. Warm sitz baths and hemorrhoid cream recommended by your caregiver helps hemorrhoid problems.  Backaches may develop during this time of your pregnancy. Avoid heavy lifting, wear low heal shoes and practice good posture to help with backache problems.  Some pregnant women develop tingling and numbness of their hand and fingers because of swelling and tightening of ligaments in the wrist (carpel tunnel syndrome). This goes away after the baby is born.  As your breasts enlarge, you may have to get a bigger bra. Get a comfortable, cotton, support bra. Do not get a nursing bra until the last month of the pregnancy if you will be nursing the baby.  You may get a dark line from your belly button to the pubic area called the linea nigra.  You may develop rosy cheeks because of increase blood flow to the face.  You may develop spider looking lines of the face, neck, arms and chest. These go away after the baby is born. HOME CARE INSTRUCTIONS   It is extremely important to avoid all smoking, herbs, alcohol, and unprescribed drugs during your pregnancy. These chemicals affect the formation and growth of the baby. Avoid these chemicals throughout the pregnancy to ensure the delivery of a healthy infant.  Most of your home care instructions are the same as  suggested for the first trimester of your pregnancy. Keep your caregiver's appointments. Follow your caregiver's instructions regarding medication use, exercise and diet.  During pregnancy, you are providing food for you and your baby. Continue to eat regular, well-balanced meals. Choose foods such as meat, fish, milk and other low fat dairy products, vegetables, fruits, and whole-grain breads and cereals. Your caregiver will tell you of the ideal weight gain.  A physical sexual relationship may be continued up until near the end of pregnancy if there are no other problems. Problems could include early (premature) leaking of amniotic fluid from the membranes, vaginal bleeding, abdominal pain, or other medical or pregnancy problems.  Exercise regularly if there are no restrictions. Check with your caregiver if you are unsure of the safety of some of your exercises. The greatest weight gain will occur in the last 2 trimesters of pregnancy. Exercise will help you:  Control your weight.  Get you in shape for labor and delivery.  Lose weight   after you have the baby.  Wear a good support or jogging bra for breast tenderness during pregnancy. This may help if worn during sleep. Pads or tissues may be used in the bra if you are leaking colostrum.  Do not use hot tubs, steam rooms or saunas throughout the pregnancy.  Wear your seat belt at all times when driving. This protects you and your baby if you are in an accident.  Avoid raw meat, uncooked cheese, cat litter boxes and soil used by cats. These carry germs that can cause birth defects in the baby.  The second trimester is also a good time to visit your dentist for your dental health if this has not been done yet. Getting your teeth cleaned is OK. Use a soft toothbrush. Brush gently during pregnancy.  It is easier to loose urine during pregnancy. Tightening up and strengthening the pelvic muscles will help with this problem. Practice stopping your  urination while you are going to the bathroom. These are the same muscles you need to strengthen. It is also the muscles you would use as if you were trying to stop from passing gas. You can practice tightening these muscles up 10 times a set and repeating this about 3 times per day. Once you know what muscles to tighten up, do not perform these exercises during urination. It is more likely to contribute to an infection by backing up the urine.  Ask for help if you have financial, counseling or nutritional needs during pregnancy. Your caregiver will be able to offer counseling for these needs as well as refer you for other special needs.  Your skin may become oily. If so, wash your face with mild soap, use non-greasy moisturizer and oil or cream based makeup. MEDICATIONS AND DRUG USE IN PREGNANCY  Take prenatal vitamins as directed. The vitamin should contain 1 milligram of folic acid. Keep all vitamins out of reach of children. Only a couple vitamins or tablets containing iron may be fatal to a baby or young child when ingested.  Avoid use of all medications, including herbs, over-the-counter medications, not prescribed or suggested by your caregiver. Only take over-the-counter or prescription medicines for pain, discomfort, or fever as directed by your caregiver. Do not use aspirin.  Let your caregiver also know about herbs you may be using.  Alcohol is related to a number of birth defects. This includes fetal alcohol syndrome. All alcohol, in any form, should be avoided completely. Smoking will cause low birth rate and premature babies.  Street or illegal drugs are very harmful to the baby. They are absolutely forbidden. A baby born to an addicted mother will be addicted at birth. The baby will go through the same withdrawal an adult does. SEEK MEDICAL CARE IF:  You have any concerns or worries during your pregnancy. It is better to call with your questions if you feel they cannot wait, rather  than worry about them. SEEK IMMEDIATE MEDICAL CARE IF:   An unexplained oral temperature above 102 F (38.9 C) develops, or as your caregiver suggests.  You have leaking of fluid from the vagina (birth canal). If leaking membranes are suspected, take your temperature and tell your caregiver of this when you call.  There is vaginal spotting, bleeding, or passing clots. Tell your caregiver of the amount and how many pads are used. Light spotting in pregnancy is common, especially following intercourse.  You develop a bad smelling vaginal discharge with a change in the color from clear   to white.  You continue to feel sick to your stomach (nauseated) and have no relief from remedies suggested. You vomit blood or coffee ground-like materials.  You lose more than 2 pounds of weight or gain more than 2 pounds of weight over 1 week, or as suggested by your caregiver.  You notice swelling of your face, hands, feet, or legs.  You get exposed to German measles and have never had them.  You are exposed to fifth disease or chickenpox.  You develop belly (abdominal) pain. Round ligament discomfort is a common non-cancerous (benign) cause of abdominal pain in pregnancy. Your caregiver still must evaluate you.  You develop a bad headache that does not go away.  You develop fever, diarrhea, pain with urination, or shortness of breath.  You develop visual problems, blurry, or double vision.  You fall or are in a car accident or any kind of trauma.  There is mental or physical violence at home. Document Released: 12/14/2000 Document Revised: 03/14/2011 Document Reviewed: 06/18/2008 [employer] Patient Information 2013 [employer].  

## 2012-03-28 NOTE — Progress Notes (Signed)
Pulse: 90

## 2012-03-28 NOTE — Progress Notes (Signed)
Doing well. Anxious to find out gender of baby.  Will do AFP only and schedule anatomy US

## 2012-04-05 ENCOUNTER — Encounter: Payer: Self-pay | Admitting: *Deleted

## 2012-04-13 ENCOUNTER — Ambulatory Visit (HOSPITAL_COMMUNITY)
Admission: RE | Admit: 2012-04-13 | Discharge: 2012-04-13 | Disposition: A | Discharge status: Home / Self Care | Payer: Medicaid Other | Source: Ambulatory Visit | Attending: Advanced Practice Midwife | Admitting: Advanced Practice Midwife

## 2012-04-13 DIAGNOSIS — Z1389 Encounter for screening for other disorder: Secondary | ICD-10-CM | POA: Insufficient documentation

## 2012-04-13 DIAGNOSIS — Z3402 Encounter for supervision of normal first pregnancy, second trimester: Secondary | ICD-10-CM

## 2012-04-13 DIAGNOSIS — O360990 Maternal care for other rhesus isoimmunization, unspecified trimester, not applicable or unspecified: Secondary | ICD-10-CM | POA: Insufficient documentation

## 2012-04-13 DIAGNOSIS — Z363 Encounter for antenatal screening for malformations: Secondary | ICD-10-CM | POA: Insufficient documentation

## 2012-04-13 DIAGNOSIS — O358XX0 Maternal care for other (suspected) fetal abnormality and damage, not applicable or unspecified: Secondary | ICD-10-CM | POA: Insufficient documentation

## 2012-04-15 ENCOUNTER — Encounter: Payer: Self-pay | Admitting: Advanced Practice Midwife

## 2012-04-25 ENCOUNTER — Ambulatory Visit (INDEPENDENT_AMBULATORY_CARE_PROVIDER_SITE_OTHER): Payer: Medicaid Other | Admitting: Advanced Practice Midwife

## 2012-04-25 ENCOUNTER — Encounter: Payer: Self-pay | Admitting: Advanced Practice Midwife

## 2012-04-25 VITALS — BP 98/54 | Temp 97.8°F | Wt 97.9 lb

## 2012-04-25 DIAGNOSIS — Z23 Encounter for immunization: Secondary | ICD-10-CM

## 2012-04-25 DIAGNOSIS — O9989 Other specified diseases and conditions complicating pregnancy, childbirth and the puerperium: Secondary | ICD-10-CM

## 2012-04-25 DIAGNOSIS — Z3492 Encounter for supervision of normal pregnancy, unspecified, second trimester: Secondary | ICD-10-CM

## 2012-04-25 LAB — POCT URINALYSIS DIP (DEVICE)
Hgb urine dipstick: NEGATIVE
Leukocytes, UA: NEGATIVE
Nitrite: NEGATIVE
Protein, ur: NEGATIVE mg/dL
Urobilinogen, UA: 0.2 mg/dL (ref 0.0–1.0)
pH: 7 (ref 5.0–8.0)

## 2012-04-25 MED ORDER — INFLUENZA VIRUS VACC SPLIT PF IM SUSP
0.5000 mL | Freq: Once | INTRAMUSCULAR | Status: AC
  Administered 2012-04-25: 0.5 mL via INTRAMUSCULAR

## 2012-04-25 NOTE — Progress Notes (Signed)
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 C/o pelvic pain and upper to lower  back pain  x 1 week, c/o contractions 5 times a day,

## 2012-04-25 NOTE — Progress Notes (Signed)
C/o low abd pain, sharp, worse with movement, no bleeding, c/w round ligament pain, Urine negative, no cramping or contractions. Also c/o some generalized back pain, rev'd comfort measures, stretches. TDAP today. Normal AFP and anatomy scan.

## 2012-04-25 NOTE — Patient Instructions (Signed)
Pregnancy - Second Trimester The second trimester of pregnancy (3 to 6 months) is a period of rapid growth for you and your baby. At the end of the sixth month, your baby is about 9 inches long and weighs 1 1/2 pounds. You will begin to feel the baby move between 18 and 20 weeks of the pregnancy. This is called quickening. Weight gain is faster. A clear fluid (colostrum) may leak out of your breasts. You may feel small contractions of the womb (uterus). This is known as false labor or Braxton-Hicks contractions. This is like a practice for labor when the baby is ready to be born. Usually, the problems with morning sickness have usually passed by the end of your first trimester. Some women develop small dark blotches (called cholasma, mask of pregnancy) on their face that usually goes away after the baby is born. Exposure to the sun makes the blotches worse. Acne may also develop in some pregnant women and pregnant women who have acne, may find that it goes away. PRENATAL EXAMS  Blood work may continue to be done during prenatal exams. These tests are done to check on your health and the probable health of your baby. Blood work is used to follow your blood levels (hemoglobin). Anemia (low hemoglobin) is common during pregnancy. Iron and vitamins are given to help prevent this. You will also be checked for diabetes between 24 and 28 weeks of the pregnancy. Some of the previous blood tests may be repeated.  The size of the uterus is measured during each visit. This is to make sure that the baby is continuing to grow properly according to the dates of the pregnancy.  Your blood pressure is checked every prenatal visit. This is to make sure you are not getting toxemia.  Your urine is checked to make sure you do not have an infection, diabetes or protein in the urine.  Your weight is checked often to make sure gains are happening at the suggested rate. This is to ensure that both you and your baby are growing  normally.  Sometimes, an ultrasound is performed to confirm the proper growth and development of the baby. This is a test which bounces harmless sound waves off the baby so your caregiver can more accurately determine due dates. Sometimes, a specialized test is done on the amniotic fluid surrounding the baby. This test is called an amniocentesis. The amniotic fluid is obtained by sticking a needle into the belly (abdomen). This is done to check the chromosomes in instances where there is a concern about possible genetic problems with the baby. It is also sometimes done near the end of pregnancy if an early delivery is required. In this case, it is done to help make sure the baby's lungs are mature enough for the baby to live outside of the womb. CHANGES OCCURING IN THE SECOND TRIMESTER OF PREGNANCY Your body goes through many changes during pregnancy. They vary from person to person. Talk to your caregiver about changes you notice that you are concerned about.  During the second trimester, you will likely have an increase in your appetite. It is normal to have cravings for certain foods. This varies from person to person and pregnancy to pregnancy.  Your lower abdomen will begin to bulge.  You may have to urinate more often because the uterus and baby are pressing on your bladder. It is also common to get more bladder infections during pregnancy (pain with urination). You can help this by   drinking lots of fluids and emptying your bladder before and after intercourse.  You may begin to get stretch marks on your hips, abdomen, and breasts. These are normal changes in the body during pregnancy. There are no exercises or medications to take that prevent this change.  You may begin to develop swollen and bulging veins (varicose veins) in your legs. Wearing support hose, elevating your feet for 15 minutes, 3 to 4 times a day and limiting salt in your diet helps lessen the problem.  Heartburn may develop  as the uterus grows and pushes up against the stomach. Antacids recommended by your caregiver helps with this problem. Also, eating smaller meals 4 to 5 times a day helps.  Constipation can be treated with a stool softener or adding bulk to your diet. Drinking lots of fluids, vegetables, fruits, and whole grains are helpful.  Exercising is also helpful. If you have been very active up until your pregnancy, most of these activities can be continued during your pregnancy. If you have been less active, it is helpful to start an exercise program such as walking.  Hemorrhoids (varicose veins in the rectum) may develop at the end of the second trimester. Warm sitz baths and hemorrhoid cream recommended by your caregiver helps hemorrhoid problems.  Backaches may develop during this time of your pregnancy. Avoid heavy lifting, wear low heal shoes and practice good posture to help with backache problems.  Some pregnant women develop tingling and numbness of their hand and fingers because of swelling and tightening of ligaments in the wrist (carpel tunnel syndrome). This goes away after the baby is born.  As your breasts enlarge, you may have to get a bigger bra. Get a comfortable, cotton, support bra. Do not get a nursing bra until the last month of the pregnancy if you will be nursing the baby.  You may get a dark line from your belly button to the pubic area called the linea nigra.  You may develop rosy cheeks because of increase blood flow to the face.  You may develop spider looking lines of the face, neck, arms and chest. These go away after the baby is born. HOME CARE INSTRUCTIONS   It is extremely important to avoid all smoking, herbs, alcohol, and unprescribed drugs during your pregnancy. These chemicals affect the formation and growth of the baby. Avoid these chemicals throughout the pregnancy to ensure the delivery of a healthy infant.  Most of your home care instructions are the same as  suggested for the first trimester of your pregnancy. Keep your caregiver's appointments. Follow your caregiver's instructions regarding medication use, exercise and diet.  During pregnancy, you are providing food for you and your baby. Continue to eat regular, well-balanced meals. Choose foods such as meat, fish, milk and other low fat dairy products, vegetables, fruits, and whole-grain breads and cereals. Your caregiver will tell you of the ideal weight gain.  A physical sexual relationship may be continued up until near the end of pregnancy if there are no other problems. Problems could include early (premature) leaking of amniotic fluid from the membranes, vaginal bleeding, abdominal pain, or other medical or pregnancy problems.  Exercise regularly if there are no restrictions. Check with your caregiver if you are unsure of the safety of some of your exercises. The greatest weight gain will occur in the last 2 trimesters of pregnancy. Exercise will help you:  Control your weight.  Get you in shape for labor and delivery.  Lose weight   after you have the baby.  Wear a good support or jogging bra for breast tenderness during pregnancy. This may help if worn during sleep. Pads or tissues may be used in the bra if you are leaking colostrum.  Do not use hot tubs, steam rooms or saunas throughout the pregnancy.  Wear your seat belt at all times when driving. This protects you and your baby if you are in an accident.  Avoid raw meat, uncooked cheese, cat litter boxes and soil used by cats. These carry germs that can cause birth defects in the baby.  The second trimester is also a good time to visit your dentist for your dental health if this has not been done yet. Getting your teeth cleaned is OK. Use a soft toothbrush. Brush gently during pregnancy.  It is easier to loose urine during pregnancy. Tightening up and strengthening the pelvic muscles will help with this problem. Practice stopping your  urination while you are going to the bathroom. These are the same muscles you need to strengthen. It is also the muscles you would use as if you were trying to stop from passing gas. You can practice tightening these muscles up 10 times a set and repeating this about 3 times per day. Once you know what muscles to tighten up, do not perform these exercises during urination. It is more likely to contribute to an infection by backing up the urine.  Ask for help if you have financial, counseling or nutritional needs during pregnancy. Your caregiver will be able to offer counseling for these needs as well as refer you for other special needs.  Your skin may become oily. If so, wash your face with mild soap, use non-greasy moisturizer and oil or cream based makeup. MEDICATIONS AND DRUG USE IN PREGNANCY  Take prenatal vitamins as directed. The vitamin should contain 1 milligram of folic acid. Keep all vitamins out of reach of children. Only a couple vitamins or tablets containing iron may be fatal to a baby or young child when ingested.  Avoid use of all medications, including herbs, over-the-counter medications, not prescribed or suggested by your caregiver. Only take over-the-counter or prescription medicines for pain, discomfort, or fever as directed by your caregiver. Do not use aspirin.  Let your caregiver also know about herbs you may be using.  Alcohol is related to a number of birth defects. This includes fetal alcohol syndrome. All alcohol, in any form, should be avoided completely. Smoking will cause low birth rate and premature babies.  Street or illegal drugs are very harmful to the baby. They are absolutely forbidden. A baby born to an addicted mother will be addicted at birth. The baby will go through the same withdrawal an adult does. SEEK MEDICAL CARE IF:  You have any concerns or worries during your pregnancy. It is better to call with your questions if you feel they cannot wait, rather  than worry about them. SEEK IMMEDIATE MEDICAL CARE IF:   An unexplained oral temperature above 102 F (38.9 C) develops, or as your caregiver suggests.  You have leaking of fluid from the vagina (birth canal). If leaking membranes are suspected, take your temperature and tell your caregiver of this when you call.  There is vaginal spotting, bleeding, or passing clots. Tell your caregiver of the amount and how many pads are used. Light spotting in pregnancy is common, especially following intercourse.  You develop a bad smelling vaginal discharge with a change in the color from clear   to white.  You continue to feel sick to your stomach (nauseated) and have no relief from remedies suggested. You vomit blood or coffee ground-like materials.  You lose more than 2 pounds of weight or gain more than 2 pounds of weight over 1 week, or as suggested by your caregiver.  You notice swelling of your face, hands, feet, or legs.  You get exposed to German measles and have never had them.  You are exposed to fifth disease or chickenpox.  You develop belly (abdominal) pain. Round ligament discomfort is a common non-cancerous (benign) cause of abdominal pain in pregnancy. Your caregiver still must evaluate you.  You develop a bad headache that does not go away.  You develop fever, diarrhea, pain with urination, or shortness of breath.  You develop visual problems, blurry, or double vision.  You fall or are in a car accident or any kind of trauma.  There is mental or physical violence at home. Document Released: 12/14/2000 Document Revised: 03/14/2011 Document Reviewed: 06/18/2008 [employer] Patient Information 2013 [employer].  

## 2012-05-17 ENCOUNTER — Inpatient Hospital Stay (HOSPITAL_COMMUNITY)
Admission: AD | Admit: 2012-05-17 | Discharge: 2012-05-17 | Disposition: A | Discharge status: Home / Self Care | Payer: Medicaid Other | Source: Ambulatory Visit | Attending: Obstetrics and Gynecology | Admitting: Obstetrics and Gynecology

## 2012-05-17 ENCOUNTER — Encounter (HOSPITAL_COMMUNITY): Payer: Self-pay | Admitting: *Deleted

## 2012-05-17 DIAGNOSIS — O212 Late vomiting of pregnancy: Secondary | ICD-10-CM | POA: Insufficient documentation

## 2012-05-17 DIAGNOSIS — R109 Unspecified abdominal pain: Secondary | ICD-10-CM | POA: Insufficient documentation

## 2012-05-17 DIAGNOSIS — O360990 Maternal care for other rhesus isoimmunization, unspecified trimester, not applicable or unspecified: Secondary | ICD-10-CM

## 2012-05-17 DIAGNOSIS — R072 Precordial pain: Secondary | ICD-10-CM | POA: Insufficient documentation

## 2012-05-17 DIAGNOSIS — O360111 Maternal care for anti-D [Rh] antibodies, first trimester, fetus 1: Secondary | ICD-10-CM

## 2012-05-17 DIAGNOSIS — K219 Gastro-esophageal reflux disease without esophagitis: Secondary | ICD-10-CM

## 2012-05-17 DIAGNOSIS — O99891 Other specified diseases and conditions complicating pregnancy: Secondary | ICD-10-CM | POA: Insufficient documentation

## 2012-05-17 HISTORY — DX: Gastro-esophageal reflux disease without esophagitis: K21.9

## 2012-05-17 LAB — URINALYSIS, ROUTINE W REFLEX MICROSCOPIC
Bilirubin Urine: NEGATIVE
Glucose, UA: NEGATIVE mg/dL
Hgb urine dipstick: NEGATIVE
Ketones, ur: NEGATIVE mg/dL
Leukocytes, UA: NEGATIVE
Nitrite: NEGATIVE
Protein, ur: NEGATIVE mg/dL
Specific Gravity, Urine: 1.01 (ref 1.005–1.030)
Urobilinogen, UA: 0.2 mg/dL (ref 0.0–1.0)
pH: 7.5 (ref 5.0–8.0)

## 2012-05-17 MED ORDER — GI COCKTAIL ~~LOC~~
30.0000 mL | Freq: Once | ORAL | Status: AC
  Administered 2012-05-17: 30 mL via ORAL
  Filled 2012-05-17: qty 30

## 2012-05-17 MED ORDER — ACETAMINOPHEN 325 MG PO TABS
650.0000 mg | ORAL_TABLET | Freq: Four times a day (QID) | ORAL | Status: DC | PRN
Start: 2012-05-17 — End: 2015-03-03

## 2012-05-17 MED ORDER — DICLOFENAC SODIUM 3 % TD GEL: 0.5000 g | Freq: Four times a day (QID) | TRANSDERMAL | Status: DC | PRN

## 2012-05-17 MED ORDER — FAMOTIDINE 20 MG PO TABS: 20.0000 mg | ORAL_TABLET | Freq: Two times a day (BID) | ORAL | Status: DC

## 2012-05-17 NOTE — MAU Provider Note (Signed)
  History     CSN: [id number]  Arrival date and time: 05/17/12 1240   None     Chief Complaint  Patient presents with  . Abdominal Pain   HPI 22 y.o. G1P0 at [redacted]w[redacted]d with mid-sternal chest pain for 3 days. Constant, severe. Located just above xyphoid process. Nothing makes it better or worse. Not sleeping well for 3 nights. Has had nausea/vomiting. States she has fevers at night, but clarified that she feels warm/sweaty at night since early in the pregnancy. No SOB or difficulty breathing. No palpitations. Pain is not related to eating or activity/exertion.    Denies contractions, bleeding, loss of fluid. Baby moving normally.  Interview conducted using phone translation services.  OB History   Grav Para Term Preterm Abortions TAB SAB Ect Mult Living   1               Past Medical History  Diagnosis Date  . Medical history non-contributory   . GERD (gastroesophageal reflux disease)     Past Surgical History  Procedure Laterality Date  . No past surgeries      No family history on file.  History  Substance Use Topics  . Smoking status: Never Smoker   . Smokeless tobacco: Never Used  . Alcohol Use: No    Allergies: No Known Allergies  Prescriptions prior to admission  Medication Sig Dispense Refill  . Prenatal Vit-Fe Fumarate-FA (PRENATAL MULTIVITAMIN) TABS Take 1 tablet by mouth daily at 12 noon.        ROS pertinent pos and neg stated in HPI  Physical Exam   Blood pressure 103/52, pulse 78, temperature 98.7 F (37.1 C), temperature source Oral, resp. rate 16, height 5\' 4"  (1.626 m), weight 46.437 kg (102 lb 6 oz), last menstrual period 11/29/2011, SpO2 100.00%.  Physical Exam  Constitutional: She is oriented to person, place, and time. She appears well-developed and well-nourished. No distress.  HENT:  Head: Normocephalic and atraumatic.  Eyes: Conjunctivae and EOM are normal.  Neck: Normal range of motion. Neck supple.  Cardiovascular: Normal rate,  regular rhythm, normal heart sounds and intact distal pulses.   Respiratory: Effort normal and breath sounds normal. No respiratory distress. She has no wheezes. She has no rales. She exhibits tenderness (tenderness just above xyphoid process mid-sternally).  GI: Soft. Bowel sounds are normal. There is no tenderness. There is no rebound and no guarding.  Musculoskeletal: Normal range of motion. She exhibits no edema and no tenderness.  Neurological: She is alert and oriented to person, place, and time.  Skin: Skin is warm and dry.  Psychiatric: She has a normal mood and affect.    MAU Course  Procedures  FHTs:  Baseline: 135-140 Variability:  Fair (1-6 bpm) Accelerations:  Present  Decelerations:  Variables, mild, occasional.   Appropriate for gestational age. TOCO:  No contractions  EKG - normal sinus rhythm, no ST changes.  I personally reviewed and interpreted the EKG.  Assessment and Plan  22 y.o. G1P0 at [redacted]w[redacted]d with chest pain - Likely reflux vs costochondritis., relieved somewhat with GI cocktail - Pepcid, tylenol, Diclofenac gel - F/U at [facility] on 5/21 - Stable for d/c home.  Napoleon Form 05/17/2012, 4:20 PM

## 2012-05-17 NOTE — MAU Note (Signed)
RN has attempted now third time with [employer]. 1st attempt was unable to hear interpreter, 2nd attempt interpreter spoke Arabic, however pt speaks different dialect.

## 2012-05-17 NOTE — MAU Note (Signed)
Per third [employer] interpreter, pt notes lower abd pain as well as epigastric pain. Does have acid reflux. Denies bleeding or vag d/c changes. No uti s/s.

## 2012-05-18 NOTE — MAU Provider Note (Signed)
Attestation of Attending Supervision of Advanced Practitioner (CNM/NP): Evaluation and management procedures were performed by the Advanced Practitioner under my supervision and collaboration.  I have reviewed the Advanced Practitioner's note and chart, and I agree with the management and plan.  Gerald Kuehl 05/18/2012 6:53 AM   

## 2012-05-23 ENCOUNTER — Ambulatory Visit (INDEPENDENT_AMBULATORY_CARE_PROVIDER_SITE_OTHER): Payer: Medicaid Other | Admitting: Family Medicine

## 2012-05-23 VITALS — BP 102/60 | Temp 97.1°F | Wt 102.7 lb

## 2012-05-23 DIAGNOSIS — Z3402 Encounter for supervision of normal first pregnancy, second trimester: Secondary | ICD-10-CM

## 2012-05-23 DIAGNOSIS — O360990 Maternal care for other rhesus isoimmunization, unspecified trimester, not applicable or unspecified: Secondary | ICD-10-CM

## 2012-05-23 LAB — POCT URINALYSIS DIP (DEVICE)
Bilirubin Urine: NEGATIVE
Hgb urine dipstick: NEGATIVE
Ketones, ur: NEGATIVE mg/dL
Leukocytes, UA: NEGATIVE
Protein, ur: 30 mg/dL — AB
pH: 7 (ref 5.0–8.0)

## 2012-05-23 NOTE — Patient Instructions (Addendum)
Pregnancy - Second Trimester The second trimester of pregnancy (3 to 6 months) is a period of rapid growth for you and your baby. At the end of the sixth month, your baby is about 9 inches long and weighs 1 1/2 pounds. You will begin to feel the baby move between 18 and 20 weeks of the pregnancy. This is called quickening. Weight gain is faster. A clear fluid (colostrum) may leak out of your breasts. You may feel small contractions of the womb (uterus). This is known as false labor or Braxton-Hicks contractions. This is like a practice for labor when the baby is ready to be born. Usually, the problems with morning sickness have usually passed by the end of your first trimester. Some women develop small dark blotches (called cholasma, mask of pregnancy) on their face that usually goes away after the baby is born. Exposure to the sun makes the blotches worse. Acne may also develop in some pregnant women and pregnant women who have acne, may find that it goes away. PRENATAL EXAMS  Blood work may continue to be done during prenatal exams. These tests are done to check on your health and the probable health of your baby. Blood work is used to follow your blood levels (hemoglobin). Anemia (low hemoglobin) is common during pregnancy. Iron and vitamins are given to help prevent this. You will also be checked for diabetes between 24 and 28 weeks of the pregnancy. Some of the previous blood tests may be repeated.  The size of the uterus is measured during each visit. This is to make sure that the baby is continuing to grow properly according to the dates of the pregnancy.  Your blood pressure is checked every prenatal visit. This is to make sure you are not getting toxemia.  Your urine is checked to make sure you do not have an infection, diabetes or protein in the urine.  Your weight is checked often to make sure gains are happening at the suggested rate. This is to ensure that both you and your baby are  growing normally.  Sometimes, an ultrasound is performed to confirm the proper growth and development of the baby. This is a test which bounces harmless sound waves off the baby so your caregiver can more accurately determine due dates. Sometimes, a test is done on the amniotic fluid surrounding the baby. This test is called an amniocentesis. The amniotic fluid is obtained by sticking a needle into the belly (abdomen). This is done to check the chromosomes in instances where there is a concern about possible genetic problems with the baby. It is also sometimes done near the end of pregnancy if an early delivery is required. In this case, it is done to help make sure the baby's lungs are mature enough for the baby to live outside of the womb. CHANGES OCCURING IN THE SECOND TRIMESTER OF PREGNANCY Your body goes through many changes during pregnancy. They vary from person to person. Talk to your caregiver about changes you notice that you are concerned about.  During the second trimester, you will likely have an increase in your appetite. It is normal to have cravings for certain foods. This varies from person to person and pregnancy to pregnancy.  Your lower abdomen will begin to bulge.  You may have to urinate more often because the uterus and baby are pressing on your bladder. It is also common to get more bladder infections during pregnancy. You can help this by drinking lots of fluids   and emptying your bladder before and after intercourse.  You may begin to get stretch marks on your hips, abdomen, and breasts. These are normal changes in the body during pregnancy. There are no exercises or medicines to take that prevent this change.  You may begin to develop swollen and bulging veins (varicose veins) in your legs. Wearing support hose, elevating your feet for 15 minutes, 3 to 4 times a day and limiting salt in your diet helps lessen the problem.  Heartburn may develop as the uterus grows and  pushes up against the stomach. Antacids recommended by your caregiver helps with this problem. Also, eating smaller meals 4 to 5 times a day helps.  Constipation can be treated with a stool softener or adding bulk to your diet. Drinking lots of fluids, and eating vegetables, fruits, and whole grains are helpful.  Exercising is also helpful. If you have been very active up until your pregnancy, most of these activities can be continued during your pregnancy. If you have been less active, it is helpful to start an exercise program such as walking.  Hemorrhoids may develop at the end of the second trimester. Warm sitz baths and hemorrhoid cream recommended by your caregiver helps hemorrhoid problems.  Backaches may develop during this time of your pregnancy. Avoid heavy lifting, wear low heal shoes, and practice good posture to help with backache problems.  Some pregnant women develop tingling and numbness of their hand and fingers because of swelling and tightening of ligaments in the wrist (carpel tunnel syndrome). This goes away after the baby is born.  As your breasts enlarge, you may have to get a bigger bra. Get a comfortable, cotton, support bra. Do not get a nursing bra until the last month of the pregnancy if you will be nursing the baby.  You may get a dark line from your belly button to the pubic area called the linea nigra.  You may develop rosy cheeks because of increase blood flow to the face.  You may develop spider looking lines of the face, neck, arms, and chest. These go away after the baby is born. HOME CARE INSTRUCTIONS   It is extremely important to avoid all smoking, herbs, alcohol, and unprescribed drugs during your pregnancy. These chemicals affect the formation and growth of the baby. Avoid these chemicals throughout the pregnancy to ensure the delivery of a healthy infant.  Most of your home care instructions are the same as suggested for the first trimester of your  pregnancy. Keep your caregiver's appointments. Follow your caregiver's instructions regarding medicine use, exercise, and diet.  During pregnancy, you are providing food for you and your baby. Continue to eat regular, well-balanced meals. Choose foods such as meat, fish, milk and other low fat dairy products, vegetables, fruits, and whole-grain breads and cereals. Your caregiver will tell you of the ideal weight gain.  A physical sexual relationship may be continued up until near the end of pregnancy if there are no other problems. Problems could include early (premature) leaking of amniotic fluid from the membranes, vaginal bleeding, abdominal pain, or other medical or pregnancy problems.  Exercise regularly if there are no restrictions. Check with your caregiver if you are unsure of the safety of some of your exercises. The greatest weight gain will occur in the last 2 trimesters of pregnancy. Exercise will help you:  Control your weight.  Get you in shape for labor and delivery.  Lose weight after you have the baby.  Wear   a good support or jogging bra for breast tenderness during pregnancy. This may help if worn during sleep. Pads or tissues may be used in the bra if you are leaking colostrum.  Do not use hot tubs, steam rooms or saunas throughout the pregnancy.  Wear your seat belt at all times when driving. This protects you and your baby if you are in an accident.  Avoid raw meat, uncooked cheese, cat litter boxes, and soil used by cats. These carry germs that can cause birth defects in the baby.  The second trimester is also a good time to visit your dentist for your dental health if this has not been done yet. Getting your teeth cleaned is okay. Use a soft toothbrush. Brush gently during pregnancy.  It is easier to leak urine during pregnancy. Tightening up and strengthening the pelvic muscles will help with this problem. Practice stopping your urination while you are going to the  bathroom. These are the same muscles you need to strengthen. It is also the muscles you would use as if you were trying to stop from passing gas. You can practice tightening these muscles up 10 times a set and repeating this about 3 times per day. Once you know what muscles to tighten up, do not perform these exercises during urination. It is more likely to contribute to an infection by backing up the urine.  Ask for help if you have financial, counseling, or nutritional needs during pregnancy. Your caregiver will be able to offer counseling for these needs as well as refer you for other special needs.  Your skin may become oily. If so, wash your face with mild soap, use non-greasy moisturizer and oil or cream based makeup. MEDICINES AND DRUG USE IN PREGNANCY  Take prenatal vitamins as directed. The vitamin should contain 1 milligram of folic acid. Keep all vitamins out of reach of children. Only a couple vitamins or tablets containing iron may be fatal to a baby or young child when ingested.  Avoid use of all medicines, including herbs, over-the-counter medicines, not prescribed or suggested by your caregiver. Only take over-the-counter or prescription medicines for pain, discomfort, or fever as directed by your caregiver. Do not use aspirin.  Let your caregiver also know about herbs you may be using.  Alcohol is related to a number of birth defects. This includes fetal alcohol syndrome. All alcohol, in any form, should be avoided completely. Smoking will cause low birth rate and premature babies.  Street or illegal drugs are very harmful to the baby. They are absolutely forbidden. A baby born to an addicted mother will be addicted at birth. The baby will go through the same withdrawal an adult does. SEEK MEDICAL CARE IF:  You have any concerns or worries during your pregnancy. It is better to call with your questions if you feel they cannot wait, rather than worry about them. SEEK IMMEDIATE  MEDICAL CARE IF:   An unexplained oral temperature above 102 F (38.9 C) develops, or as your caregiver suggests.  You have leaking of fluid from the vagina (birth canal). If leaking membranes are suspected, take your temperature and tell your caregiver of this when you call.  There is vaginal spotting, bleeding, or passing clots. Tell your caregiver of the amount and how many pads are used. Light spotting in pregnancy is common, especially following intercourse.  You develop a bad smelling vaginal discharge with a change in the color from clear to white.  You continue to feel   sick to your stomach (nauseated) and have no relief from remedies suggested. You vomit blood or coffee ground-like materials.  You lose more than 2 pounds of weight or gain more than 2 pounds of weight over 1 week, or as suggested by your caregiver.  You notice swelling of your face, hands, feet, or legs.  You get exposed to German measles and have never had them.  You are exposed to fifth disease or chickenpox.  You develop belly (abdominal) pain. Round ligament discomfort is a common non-cancerous (benign) cause of abdominal pain in pregnancy. Your caregiver still must evaluate you.  You develop a bad headache that does not go away.  You develop fever, diarrhea, pain with urination, or shortness of breath.  You develop visual problems, blurry, or double vision.  You fall or are in a car accident or any kind of trauma.  There is mental or physical violence at home. Document Released: 12/14/2000 Document Revised: 09/14/2011 Document Reviewed: 06/18/2008 Exit[employer] Patient Information 2014 [employer].  

## 2012-05-23 NOTE — Progress Notes (Signed)
Pulse-  Pt needs refill of PNV

## 2012-05-23 NOTE — Progress Notes (Signed)
No problems.  Seen in MAU for substernal chest pain.  Using pepcid, resolved. No other complaints.

## 2012-06-20 ENCOUNTER — Ambulatory Visit (INDEPENDENT_AMBULATORY_CARE_PROVIDER_SITE_OTHER): Payer: Medicaid Other | Admitting: Advanced Practice Midwife

## 2012-06-20 VITALS — BP 88/60 | Temp 97.4°F | Wt 105.0 lb

## 2012-06-20 DIAGNOSIS — O360131 Maternal care for anti-D [Rh] antibodies, third trimester, fetus 1: Secondary | ICD-10-CM

## 2012-06-20 DIAGNOSIS — O360990 Maternal care for other rhesus isoimmunization, unspecified trimester, not applicable or unspecified: Secondary | ICD-10-CM

## 2012-06-20 DIAGNOSIS — Z3493 Encounter for supervision of normal pregnancy, unspecified, third trimester: Secondary | ICD-10-CM

## 2012-06-20 LAB — CBC
HCT: 33.1 % — ABNORMAL LOW (ref 36.0–46.0)
Hemoglobin: 11.4 g/dL — ABNORMAL LOW (ref 12.0–15.0)
MCH: 29.8 pg (ref 26.0–34.0)
MCHC: 34.4 g/dL (ref 30.0–36.0)
RDW: 14.7 % (ref 11.5–15.5)

## 2012-06-20 MED ORDER — RHO D IMMUNE GLOBULIN 300 MCG IM INJ: 300.0000 ug | INJECTION | Freq: Once | INTRAMUSCULAR | Status: DC

## 2012-06-20 MED ORDER — RHO D IMMUNE GLOBULIN 1500 UNIT/2ML IJ SOLN
300.0000 ug | Freq: Once | INTRAMUSCULAR | Status: AC
  Administered 2012-06-20: 300 ug via INTRAMUSCULAR

## 2012-06-20 NOTE — Addendum Note (Signed)
Addended by: Kathee Delton on: 06/20/2012 11:34 AM   Modules accepted: Orders

## 2012-06-20 NOTE — Patient Instructions (Signed)
Pregnancy - Third Trimester  The third trimester begins at the 28th week of pregnancy and ends at birth. It is important to follow your doctor's instructions.  HOME CARE    Go to your doctor's visits.   Do not smoke.   Do not drink alcohol or use drugs.   Only take medicine as told by your doctor.   Take prenatal vitamins as told. The vitamin should contain 1 milligram of folic acid.   Exercise.   Eat healthy foods. Eat regular, well-balanced meals.   You can have sex (intercourse) if there are no other problems with the pregnancy.   Do not use hot tubs, steam rooms, or saunas.   Wear a seat belt while driving.   Avoid raw meat, uncooked cheese, and litter boxes and soil used by cats.   Rest with your legs raised (elevated).   Make a list of emergency phone numbers. Keep this list with you.   Arrange for help when you come back home after delivering the baby.   Make a trial run to the hospital.   Take prenatal classes.   Prepare the baby's nursery.   Do not travel out of the city. If you absolutely have to, get permission from your doctor first.   Wear flat shoes. Do not wear high heels.  GET HELP RIGHT AWAY IF:    You have a temperature by mouth above 102 F (38.9 C), not controlled by medicine.   You have not felt the baby move for more than 1 hour. If you think the baby is not moving as much as normal, eat something with sugar in it or lie down on your left side for an hour. The baby should move at least 4 to 5 times per hour.   Fluid is coming from the vagina.   Blood is coming from the vagina. Light spotting is common, especially after sex (intercourse).   You have belly (abdominal) pain.   You have a bad smelling fluid (discharge) coming from the vagina. The fluid changes from clear to white.   You still feel sick to your stomach (nauseous).   You throw up (vomit) for more than 24 hours.   You have the chills.   You have shortness of breath.   You have a burning feeling when you  pee (urinate).   You lose or gain more than 2 pounds (0.9 kilograms) of weight over a week, or as told by your doctor.   Your face, hands, feet, or legs get puffy (swell).   You have a bad headache that will not go away.   You start to have problems seeing (blurry or double vision).   You fall, are in a car accident, or have any kind of trauma.   There is mental or physical violence at home.   You have any concerns or worries during your pregnancy.  MAKE SURE YOU:    Understand these instructions.   Will watch your condition.   Will get help right away if you are not doing well or get worse.  Document Released: 03/16/2009 Document Revised: 03/14/2011 Document Reviewed: 03/16/2009  ExitCare Patient Information 2014 [employer].

## 2012-06-20 NOTE — Progress Notes (Signed)
I have seen the patient with the resident/student and agree with the above.  Hogan, Heather Donovan  

## 2012-06-20 NOTE — Progress Notes (Signed)
Pulse: 84

## 2012-06-20 NOTE — Progress Notes (Signed)
Brooke Perry is 22 y.o. G1P0 [redacted]w[redacted]d she denies LOF, bleeding or regular contractions. She is feeling good and feeling her baby move frequently.   O: BP 88/60  Temp(Src) 97.4 F (36.3 C)  Wt 105 lb (47.628 kg)  BMI 18.01 kg/m2  LMP 11/29/2011 Abd: gravid  A/P:  - Boy baby, does NOT desire circ - A negative --> rhogam today - 1 hour Glucola today - PTL and bleeding precautions reviewed - F/U: 2 weeks

## 2012-06-20 NOTE — Addendum Note (Signed)
Addended by: Franchot Mimes on: 06/20/2012 11:13 AM   Modules accepted: Orders

## 2012-06-21 LAB — POCT URINALYSIS DIP (DEVICE)
Hgb urine dipstick: NEGATIVE
Ketones, ur: NEGATIVE mg/dL
Protein, ur: NEGATIVE mg/dL
Specific Gravity, Urine: 1.015 (ref 1.005–1.030)
Urobilinogen, UA: 0.2 mg/dL (ref 0.0–1.0)
pH: 7 (ref 5.0–8.0)

## 2012-06-21 LAB — RPR

## 2012-07-04 ENCOUNTER — Ambulatory Visit (INDEPENDENT_AMBULATORY_CARE_PROVIDER_SITE_OTHER): Payer: Medicaid Other | Admitting: Advanced Practice Midwife

## 2012-07-04 VITALS — BP 101/63 | Wt 102.1 lb

## 2012-07-04 DIAGNOSIS — O9989 Other specified diseases and conditions complicating pregnancy, childbirth and the puerperium: Secondary | ICD-10-CM

## 2012-07-04 DIAGNOSIS — Z283 Underimmunization status: Secondary | ICD-10-CM

## 2012-07-04 LAB — POCT URINALYSIS DIP (DEVICE)
Hgb urine dipstick: NEGATIVE
Nitrite: NEGATIVE
Protein, ur: 30 mg/dL — AB
Specific Gravity, Urine: 1.015 (ref 1.005–1.030)
Urobilinogen, UA: 0.2 mg/dL (ref 0.0–1.0)
pH: 7 (ref 5.0–8.0)

## 2012-07-04 NOTE — Progress Notes (Signed)
Pt has lost weight since last visit. She is fasting at this time, and can eat after sundown. She can be excused from fasting d/t the pregnancy. I encouraged the patient to consider using the excuse at this time. She will think about it. I also encouraged to use a scale at home. If weight loss continues to consider the excuse from fasting.

## 2012-07-04 NOTE — Progress Notes (Signed)
Pulse: 76

## 2012-07-18 ENCOUNTER — Ambulatory Visit (INDEPENDENT_AMBULATORY_CARE_PROVIDER_SITE_OTHER): Payer: Medicaid Other | Admitting: Advanced Practice Midwife

## 2012-07-18 VITALS — BP 97/62 | Temp 97.0°F | Wt 102.1 lb

## 2012-07-18 DIAGNOSIS — Z3403 Encounter for supervision of normal first pregnancy, third trimester: Secondary | ICD-10-CM

## 2012-07-18 DIAGNOSIS — O360990 Maternal care for other rhesus isoimmunization, unspecified trimester, not applicable or unspecified: Secondary | ICD-10-CM

## 2012-07-18 DIAGNOSIS — O9989 Other specified diseases and conditions complicating pregnancy, childbirth and the puerperium: Secondary | ICD-10-CM

## 2012-07-18 LAB — POCT URINALYSIS DIP (DEVICE)
Bilirubin Urine: NEGATIVE
Hgb urine dipstick: NEGATIVE
Leukocytes, UA: NEGATIVE
Nitrite: NEGATIVE
Protein, ur: NEGATIVE mg/dL
pH: 7.5 (ref 5.0–8.0)

## 2012-07-18 NOTE — Progress Notes (Signed)
No PIH sx.  Mild dizziness when she stands up, has been fasting during the day for 18 days for Ramadan.  Has not been using scale at home.  No concerns at this time.      Fundal Height is a little behind for GA, monitor weight for next visit and possible [place] for next visit if it continues to be small.

## 2012-07-18 NOTE — Progress Notes (Signed)
I have seen the patient with the resident/student and agree with the above.  Brooke Perry  

## 2012-07-18 NOTE — Progress Notes (Signed)
Pulse 88 [employer] interp. #[id number]

## 2012-07-24 ENCOUNTER — Inpatient Hospital Stay (HOSPITAL_COMMUNITY)
Admission: AD | Admit: 2012-07-24 | Discharge: 2012-07-24 | Disposition: A | Discharge status: Home / Self Care | Payer: Medicaid Other | Source: Ambulatory Visit | Attending: Obstetrics & Gynecology | Admitting: Obstetrics & Gynecology

## 2012-07-24 ENCOUNTER — Encounter (HOSPITAL_COMMUNITY): Payer: Self-pay | Admitting: *Deleted

## 2012-07-24 DIAGNOSIS — N930 Postcoital and contact bleeding: Secondary | ICD-10-CM

## 2012-07-24 DIAGNOSIS — R1032 Left lower quadrant pain: Secondary | ICD-10-CM | POA: Insufficient documentation

## 2012-07-24 DIAGNOSIS — O4690 Antepartum hemorrhage, unspecified, unspecified trimester: Secondary | ICD-10-CM | POA: Insufficient documentation

## 2012-07-24 LAB — KLEIHAUER-BETKE STAIN
# Vials RhIg: 1
Fetal Cells %: 0 %
Quantitation Fetal Hemoglobin: 0 mL

## 2012-07-24 LAB — OB RESULTS CONSOLE GC/CHLAMYDIA: Gonorrhea: NEGATIVE

## 2012-07-24 LAB — WET PREP, GENITAL: Yeast Wet Prep HPF POC: NONE SEEN

## 2012-07-24 NOTE — MAU Note (Signed)
Pt having vaginal bleeding since 1200.  Abd pain that comes and goes since 0140.  Pt had intercourse this evening around 2330.

## 2012-07-24 NOTE — MAU Provider Note (Signed)
History     CSN: [id number]  Arrival date and time: 07/24/12 0154   First Provider Initiated Contact with Patient 07/24/12 0244      Chief Complaint  Patient presents with  . Vaginal Bleeding   HPI 22 yo G1P0 at [redacted]w[redacted]d presenting with vaginal bleeding and mild intermittent abdominal cramping after sexual intercourse.  Bleeding started today after intercourse but currently has stopped.  Abdominal cramping is in LLQ and transversely in lower abdomen.  Denies vaginal discharge, vaginal itchiness, feeling of gush of fluid.  Contractions have been every minute or so and have gotten better since she has been in MAU.  Reports first time she bled in pregnancy was 4 months ago.  Has not used anything for pain.      [facility]ce[facility], no complications this pregnancy. Normal ultrasound and genetic screening, 1 hour GTT 87. No previa.  Past Medical History  Diagnosis Date  . GERD (gastroesophageal reflux disease)     Past Surgical History  Procedure Laterality Date  . No past surgeries      History reviewed. No pertinent family history.  History  Substance Use Topics  . Smoking status: Never Smoker   . Smokeless tobacco: Never Used  . Alcohol Use: No    Allergies: No Known Allergies  Prescriptions prior to admission  Medication Sig Dispense Refill  . acetaminophen (TYLENOL) 325 MG tablet Take 2 tablets (650 mg total) by mouth every 6 (six) hours as needed for pain.  60 tablet  0  . famotidine (PEPCID) 20 MG tablet Take 1 tablet (20 mg total) by mouth 2 (two) times daily.  60 tablet  1  . Prenatal Vit-Fe Fumarate-FA (PRENATAL MULTIVITAMIN) TABS Take 1 tablet by mouth daily at 12 noon.      . Diclofenac Sodium 3 % GEL Place 0.5-1 g onto the skin every 6 (six) hours as needed.  50 g  0    Review of Systems  Gastrointestinal: Positive for abdominal pain.  Genitourinary: Negative for dysuria and flank pain.   Physical Exam   Blood pressure 99/56, pulse 104,  temperature 97.8 F (36.6 C), temperature source Oral, resp. rate 18, height 5\' 3"  (1.6 m), weight 48.535 kg (107 lb), last menstrual period 11/29/2011, SpO2 100.00%.  Physical Exam  Nursing note and vitals reviewed. Constitutional: She is oriented to person, place, and time. She appears well-developed and well-nourished. No distress.  HENT:  Head: Normocephalic and atraumatic.  Cardiovascular: Normal rate, regular rhythm and normal heart sounds.   Respiratory: Effort normal and breath sounds normal. No respiratory distress. She has no wheezes. She has no rales.  GI: Soft. Bowel sounds are normal. There is no rebound and no guarding.  Genitourinary: Uterus normal.  Minimal Vaginal blood posteriorly  Neurological: She is alert and oriented to person, place, and time.  Skin: Skin is warm and dry. She is not diaphoretic.  Psychiatric: She has a normal mood and affect. Her behavior is normal.    Dilation: Closed Effacement (%): Thick Exam by:: Dr. Thad Ranger  No results found for this or any previous visit (from the past 24 hour(s)). FHTs:  135, moderate variability, accels present, no decels TOCO:  Rare contraction, irritability  MAU Course  Procedures  MDM KB pending GC pending Wet prep  Results for orders placed during the hospital encounter of 07/24/12 (from the past 48 hour(s))  GC/CHLAMYDIA PROBE AMP     Status: None   Collection Time    07/24/12  3:20 AM      Result Value Range   CT Probe RNA NEGATIVE  NEGATIVE   GC Probe RNA NEGATIVE  NEGATIVE   Comment: (NOTE)                                                                                              Normal Reference Range: Negative          Assay performed using the Gen-Probe APTIMA COMBO2 (R) Assay.     Acceptable specimen types for this assay include APTIMA Swabs (Unisex,     endocervical, urethral, or vaginal), first void urine, and ThinPrep     liquid based cytology samples.  WET PREP, GENITAL     Status:  Abnormal   Collection Time    07/24/12  3:20 AM      Result Value Range   Yeast Wet Prep HPF POC NONE SEEN  NONE SEEN   Trich, Wet Prep NONE SEEN  NONE SEEN   Clue Cells Wet Prep HPF POC NONE SEEN  NONE SEEN   WBC, Wet Prep HPF POC FEW (*) NONE SEEN   Comment: FEW BACTERIA SEEN  KLEIHAUER-BETKE STAIN     Status: None   Collection Time    07/24/12  4:50 AM      Result Value Range   Fetal Cells % 0.0     Quantitation Fetal Hemoglobin 0.0     # Vials RhIg 1       Assessment and Plan  A: IUP at [redacted]w[redacted]d  Postcoital Bleeding in  Blood type :  A negative  P:   KB lab and GC pending Discussed no sexual activity for 1 week as well as symptoms to expect postcoitus  Discussed contraction counts  Follow up with clinic as scheduled on 08/01/12 Return if symptoms worsen   Chrissie Noa 07/24/2012, 3:00 AM  I saw and examined patient and agree with above student note. I reviewed history, imaging, labs, and vitals. I personally reviewed the fetal heart tracing, and it is reactive.  Postcoital bleeding in pregnancy - likely due to cervical ectropion. Labs negative, NST reassuring, no contractions, cervix closed. Preterm labor precautions discussed. Stable for discharge home. Napoleon Form, MD

## 2012-07-24 NOTE — MAU Note (Signed)
Dr. Thad Ranger at the bedside to discuss plan of care with language line.

## 2012-08-01 ENCOUNTER — Ambulatory Visit (INDEPENDENT_AMBULATORY_CARE_PROVIDER_SITE_OTHER): Payer: Medicaid Other | Admitting: Obstetrics & Gynecology

## 2012-08-01 VITALS — BP 102/58 | Wt 106.5 lb

## 2012-08-01 DIAGNOSIS — Z609 Problem related to social environment, unspecified: Secondary | ICD-10-CM

## 2012-08-01 DIAGNOSIS — Z603 Acculturation difficulty: Secondary | ICD-10-CM

## 2012-08-01 DIAGNOSIS — Z3402 Encounter for supervision of normal first pregnancy, second trimester: Secondary | ICD-10-CM

## 2012-08-01 DIAGNOSIS — Z23 Encounter for immunization: Secondary | ICD-10-CM

## 2012-08-01 DIAGNOSIS — Z283 Underimmunization status: Secondary | ICD-10-CM

## 2012-08-01 DIAGNOSIS — O9989 Other specified diseases and conditions complicating pregnancy, childbirth and the puerperium: Secondary | ICD-10-CM

## 2012-08-01 LAB — POCT URINALYSIS DIP (DEVICE)
Bilirubin Urine: NEGATIVE
Glucose, UA: NEGATIVE mg/dL
Hgb urine dipstick: NEGATIVE
Leukocytes, UA: NEGATIVE
Nitrite: NEGATIVE
Urobilinogen, UA: 0.2 mg/dL (ref 0.0–1.0)
pH: 7.5 (ref 5.0–8.0)

## 2012-08-01 MED ORDER — PRENATAL MULTIVITAMIN CH: 1.0000 | ORAL_TABLET | Freq: Every day | ORAL | Status: DC

## 2012-08-01 MED ORDER — TETANUS-DIPHTH-ACELL PERTUSSIS 5-2.5-18.5 LF-MCG/0.5 IM SUSP
0.5000 mL | Freq: Once | INTRAMUSCULAR | Status: AC
  Administered 2012-08-01: 0.5 mL via INTRAMUSCULAR

## 2012-08-01 NOTE — Progress Notes (Signed)
Patient is Arabic-speaking only, Arabic interpreter present for this encounter.  Counseled about Tdap, gave Arabic information sheet from [facility] website, patient wants to get this today.  Pelvic cultures next visit.  No other complaints or concerns.  Fetal movement and labor precautions reviewed.

## 2012-08-01 NOTE — Progress Notes (Signed)
Pulse 102 Needs refill on her PNV

## 2012-08-01 NOTE — Patient Instructions (Signed)
Return to clinic for any obstetric concerns or go to MAU for evaluation  

## 2012-08-13 ENCOUNTER — Encounter: Payer: Self-pay | Admitting: *Deleted

## 2012-08-15 ENCOUNTER — Encounter: Payer: Self-pay | Admitting: Family Medicine

## 2012-08-15 ENCOUNTER — Ambulatory Visit (INDEPENDENT_AMBULATORY_CARE_PROVIDER_SITE_OTHER): Payer: Medicaid Other | Admitting: Family Medicine

## 2012-08-15 VITALS — BP 101/65 | Temp 98.5°F | Wt 107.0 lb

## 2012-08-15 DIAGNOSIS — Z3480 Encounter for supervision of other normal pregnancy, unspecified trimester: Secondary | ICD-10-CM

## 2012-08-15 DIAGNOSIS — Z3402 Encounter for supervision of normal first pregnancy, second trimester: Secondary | ICD-10-CM

## 2012-08-15 DIAGNOSIS — O360990 Maternal care for other rhesus isoimmunization, unspecified trimester, not applicable or unspecified: Secondary | ICD-10-CM

## 2012-08-15 DIAGNOSIS — O360131 Maternal care for anti-D [Rh] antibodies, third trimester, fetus 1: Secondary | ICD-10-CM

## 2012-08-15 LAB — POCT URINALYSIS DIP (DEVICE)
Bilirubin Urine: NEGATIVE
Leukocytes, UA: NEGATIVE
Nitrite: NEGATIVE
Protein, ur: NEGATIVE mg/dL
pH: 7 (ref 5.0–8.0)

## 2012-08-15 NOTE — Addendum Note (Signed)
Addended by: Franchot Mimes on: 08/15/2012 10:57 AM   Modules accepted: Orders

## 2012-08-15 NOTE — Progress Notes (Signed)
  Subjective:    Brooke Perry is a 22 y.o. female being seen today for her obstetrical visit. She is at [redacted]w[redacted]d gestation. Patient reports no bleeding, no contractions, no cramping and no leaking. Fetal movement: normal.  Menstrual History: OB History   Grav Para Term Preterm Abortions TAB SAB Ect Mult Living   1                Patient's last menstrual period was 11/29/2011.    The following portions of the patient's history were reviewed and updated as appropriate: allergies, current medications, past family history, past medical history, past social history, past surgical history and problem list.  Review of Systems Pertinent items are noted in HPI.   Objective:    BP 101/65  Temp(Src) 98.5 F (36.9 C)  Wt 107 lb (48.535 kg)  BMI 18.96 kg/m2  LMP 11/29/2011 FHT:  135 BPM  Uterine Size: 36 cm  Presentation: unsure     Assessment:  Brooke Perry is a 22 y.o. G1P0 at [redacted]w[redacted]d here for routine appt Plan:   Brooke Perry is a 22 y.o. G1P0 at [redacted]w[redacted]d  here for ROB visit.  Discussed with Patient:  -Plans to breast feed.  All questions answered. -Continue prenatal vitamins. -Reviewed fetal kick counts Pt to perform daily at a time when the baby is active, lie laterally with both hands on belly in quiet room and count all movements (hiccups, shoulder rolls, obvious kicks, etc); pt is to report to clinic L&D for less than 10 movements felt in a one hour time period-pt told as soon as she counts 10 movements the count is complete.  - Routine precautions discussed (depression, infection s/s).   Patient provided with all pertinent phone numbers for emergencies. - RTC for any VB, regular, painful cramps/ctxs occurring at a rate of >2/10 min, fever (100.5 or higher), n/v/d, any pain that is unresolving or worsening, LOF, decreased fetal movement, CP, SOB, edema - RTC in 2 weeks for next appt. - Did GBS swabs today and will f/u results and call if abnormal.  Contact#:  Pt has no drug allergies,  so will get PCN if GBS+ OR will get sensitivities on GBS swab as pt is PCN-allergic.  Problems: Patient Active Problem List   Diagnosis Date Noted  . Language barrier, cultural differences 08/01/2012  . Rh negative state in antepartum period 03/28/2012  . Rubella non-immune status, antepartum 03/04/2012  . Encounter for supervision of normal first pregnancy in second trimester 02/29/2012    To Do: 1. GBS collected today 2. Labor bag packed  [x ] Vaccines: Flu:  Tdap: recd [x ] BCM: depo [ ]  Readiness: baby has a place to sleep, car seat, other baby necessities.  Edu: [x ] PTL precautions; [x ] BF class; [ ]  childbirth class; [ ]   BF counseling;

## 2012-08-15 NOTE — Patient Instructions (Signed)

## 2012-08-15 NOTE — Progress Notes (Signed)
Pulse: 90

## 2012-08-18 LAB — CULTURE, BETA STREP (GROUP B ONLY)

## 2012-08-20 ENCOUNTER — Encounter: Payer: Self-pay | Admitting: Family Medicine

## 2012-08-22 ENCOUNTER — Ambulatory Visit (INDEPENDENT_AMBULATORY_CARE_PROVIDER_SITE_OTHER): Payer: Medicaid Other | Admitting: Family

## 2012-08-22 VITALS — BP 103/63 | Temp 97.0°F | Wt 109.4 lb

## 2012-08-22 DIAGNOSIS — O9989 Other specified diseases and conditions complicating pregnancy, childbirth and the puerperium: Secondary | ICD-10-CM

## 2012-08-22 DIAGNOSIS — Z3402 Encounter for supervision of normal first pregnancy, second trimester: Secondary | ICD-10-CM

## 2012-08-22 LAB — POCT URINALYSIS DIP (DEVICE)
Leukocytes, UA: NEGATIVE
Nitrite: NEGATIVE
Protein, ur: NEGATIVE mg/dL
pH: 7 (ref 5.0–8.0)

## 2012-08-22 NOTE — Progress Notes (Signed)
Pulse-  95 

## 2012-08-22 NOTE — Progress Notes (Signed)
No questions or concerns; GBS results reviewed. Questionable presentation > to Diane for scan > VTX

## 2012-08-29 ENCOUNTER — Ambulatory Visit (INDEPENDENT_AMBULATORY_CARE_PROVIDER_SITE_OTHER): Payer: Medicaid Other | Admitting: Advanced Practice Midwife

## 2012-08-29 VITALS — BP 97/61 | Temp 97.7°F | Wt 108.0 lb

## 2012-08-29 DIAGNOSIS — O360131 Maternal care for anti-D [Rh] antibodies, third trimester, fetus 1: Secondary | ICD-10-CM

## 2012-08-29 DIAGNOSIS — IMO0002 Reserved for concepts with insufficient information to code with codable children: Secondary | ICD-10-CM

## 2012-08-29 DIAGNOSIS — Z609 Problem related to social environment, unspecified: Secondary | ICD-10-CM

## 2012-08-29 DIAGNOSIS — O360990 Maternal care for other rhesus isoimmunization, unspecified trimester, not applicable or unspecified: Secondary | ICD-10-CM

## 2012-08-29 DIAGNOSIS — Z603 Acculturation difficulty: Secondary | ICD-10-CM

## 2012-08-29 LAB — POCT URINALYSIS DIP (DEVICE)
Glucose, UA: NEGATIVE mg/dL
Leukocytes, UA: NEGATIVE
Nitrite: NEGATIVE
Protein, ur: NEGATIVE mg/dL
Urobilinogen, UA: 0.2 mg/dL (ref 0.0–1.0)

## 2012-08-29 NOTE — Progress Notes (Signed)
Doing well.  Good fetal movement, denies vaginal bleeding, LOF, regular contractions.  Vertex by [place]. Reviewed signs of labor.

## 2012-08-29 NOTE — Progress Notes (Signed)
Pulse: 90

## 2012-09-10 ENCOUNTER — Ambulatory Visit (INDEPENDENT_AMBULATORY_CARE_PROVIDER_SITE_OTHER): Payer: Medicaid Other | Admitting: Obstetrics and Gynecology

## 2012-09-10 VITALS — BP 106/70 | Wt 109.5 lb

## 2012-09-10 DIAGNOSIS — O9989 Other specified diseases and conditions complicating pregnancy, childbirth and the puerperium: Secondary | ICD-10-CM

## 2012-09-10 DIAGNOSIS — N390 Urinary tract infection, site not specified: Secondary | ICD-10-CM

## 2012-09-10 DIAGNOSIS — Z283 Underimmunization status: Secondary | ICD-10-CM

## 2012-09-10 LAB — POCT URINALYSIS DIP (DEVICE)
Glucose, UA: NEGATIVE mg/dL
Nitrite: NEGATIVE
Urobilinogen, UA: 1 mg/dL (ref 0.0–1.0)

## 2012-09-10 MED ORDER — CEPHALEXIN 500 MG PO CAPS: 500.0000 mg | ORAL_CAPSULE | Freq: Four times a day (QID) | ORAL | Status: DC

## 2012-09-10 NOTE — Progress Notes (Signed)
Pulse- 98 

## 2012-09-10 NOTE — Progress Notes (Signed)
Some dysuria and frequency. Denies hematuria, urgency, mid to upper back pain, fever, nausea vomiting. Proteinuria  On dipstick. Will check C&S and Rx Keflex.  She has had some Braxton Hicks contractions that are not painful. She declined cervical exam today. Review signs and symptoms of labor and danger signs. Kick counts reviewed.

## 2012-09-10 NOTE — Addendum Note (Signed)
Addended by: Franchot Mimes on: 09/10/2012 03:13 PM   Modules accepted: Orders

## 2012-09-10 NOTE — Patient Instructions (Addendum)
Braxton Hicks Contractions Pregnancy is commonly associated with contractions of the uterus throughout the pregnancy. Towards the end of pregnancy (32 to 34 weeks), these contractions [facility] Willa Rough) can develop more often and may become more forceful. This is not true labor because these contractions do not result in opening (dilatation) and thinning of the cervix. They are sometimes difficult to tell apart from true labor because these contractions can be forceful and people have different pain tolerances. You should not feel embarrassed if you go to the hospital with false labor. Sometimes, the only way to tell if you are in true labor is for your caregiver to follow the changes in the cervix. How to tell the difference between true and false labor:  False labor.  The contractions of false labor are usually shorter, irregular and not as hard as those of true labor.  They are often felt in the front of the lower abdomen and in the groin.  They may leave with walking around or changing positions while lying down.  They get weaker and are shorter lasting as time goes on.  These contractions are usually irregular.  They do not usually become progressively stronger, regular and closer together as with true labor.  True labor.  Contractions in true labor last 30 to 70 seconds, become very regular, usually become more intense, and increase in frequency.  They do not go away with walking.  The discomfort is usually felt in the top of the uterus and spreads to the lower abdomen and low back.  True labor can be determined by your caregiver with an exam. This will show that the cervix is dilating and getting thinner. If there are no prenatal problems or other health problems associated with the pregnancy, it is completely safe to be sent home with false labor and await the onset of true labor. HOME CARE INSTRUCTIONS   Keep up with your usual exercises and instructions.  Take medications as  directed.  Keep your regular prenatal appointment.  Eat and drink lightly if you think you are going into labor.  If BH contractions are making you uncomfortable:  Change your activity position from lying down or resting to walking/walking to resting.  Sit and rest in a tub of warm water.  Drink 2 to 3 glasses of water. Dehydration may cause B-H contractions.  Do slow and deep breathing several times an hour. SEEK IMMEDIATE MEDICAL CARE IF:   Your contractions continue to become stronger, more regular, and closer together.  You have a gushing, burst or leaking of fluid from the vagina.  An oral temperature above 102 F (38.9 C) develops.  You have passage of blood-tinged mucus.  You develop vaginal bleeding.  You develop continuous belly (abdominal) pain.  You have low back pain that you never had before.  You feel the baby's head pushing down causing pelvic pressure.  The baby is not moving as much as it used to. Document Released: 12/20/2004 Document Revised: 03/14/2011 Document Reviewed: 06/13/2008 [facility] Patient Information 2014 [place], [place]. Urinary Tract Infection Urinary tract infections (UTIs) can develop anywhere along your urinary tract. Your urinary tract is your body's drainage system for removing wastes and extra water. Your urinary tract includes two kidneys, two ureters, a bladder, and a urethra. Your kidneys are a pair of bean-shaped organs. Each kidney is about the size of your fist. They are located below your ribs, one on each side of your spine. CAUSES Infections are caused by microbes, which are microscopic  organisms, including fungi, viruses, and bacteria. These organisms are so small that they can only be seen through a microscope. Bacteria are the microbes that most commonly cause UTIs. SYMPTOMS  Symptoms of UTIs may vary by age and gender of the patient and by the location of the infection. Symptoms in young women typically include a frequent  and intense urge to urinate and a painful, burning feeling in the bladder or urethra during urination. Older women and men are more likely to be tired, shaky, and weak and have muscle aches and abdominal pain. A fever may mean the infection is in your kidneys. Other symptoms of a kidney infection include pain in your back or sides below the ribs, nausea, and vomiting. DIAGNOSIS To diagnose a UTI, your caregiver will ask you about your symptoms. Your caregiver also will ask to provide a urine sample. The urine sample will be tested for bacteria and white blood cells. White blood cells are made by your body to help fight infection. TREATMENT  Typically, UTIs can be treated with medication. Because most UTIs are caused by a bacterial infection, they usually can be treated with the use of antibiotics. The choice of antibiotic and length of treatment depend on your symptoms and the type of bacteria causing your infection. HOME CARE INSTRUCTIONS  If you were prescribed antibiotics, take them exactly as your caregiver instructs you. Finish the medication even if you feel better after you have only taken some of the medication.  Drink enough water and fluids to keep your urine clear or pale yellow.  Avoid caffeine, tea, and carbonated beverages. They tend to irritate your bladder.  Empty your bladder often. Avoid holding urine for long periods of time.  Empty your bladder before and after sexual intercourse.  After a bowel movement, women should cleanse from front to back. Use each tissue only once. SEEK MEDICAL CARE IF:   You have back pain.  You develop a fever.  Your symptoms do not begin to resolve within 3 days. SEEK IMMEDIATE MEDICAL CARE IF:   You have severe back pain or lower abdominal pain.  You develop chills.  You have nausea or vomiting.  You have continued burning or discomfort with urination. MAKE SURE YOU:   Understand these instructions.  Will watch your  condition.  Will get help right away if you are not doing well or get worse. Document Released: 09/29/2004 Document Revised: 06/21/2011 Document Reviewed: 01/28/2011 [facility] Patient Information 2014 [place], [place].

## 2012-09-12 ENCOUNTER — Encounter (HOSPITAL_COMMUNITY): Payer: Self-pay

## 2012-09-12 ENCOUNTER — Inpatient Hospital Stay (HOSPITAL_COMMUNITY)
Admission: AD | Admit: 2012-09-12 | Discharge: 2012-09-14 | DRG: 775 | Disposition: A | Discharge status: Home / Self Care | Payer: Medicaid Other | Source: Ambulatory Visit | Attending: Obstetrics & Gynecology | Admitting: Obstetrics & Gynecology

## 2012-09-12 ENCOUNTER — Inpatient Hospital Stay (HOSPITAL_COMMUNITY)
Admission: AD | Admit: 2012-09-12 | Discharge: 2012-09-12 | Disposition: A | Discharge status: Home / Self Care | Payer: Medicaid Other | Source: Ambulatory Visit | Attending: Obstetrics & Gynecology | Admitting: Obstetrics & Gynecology

## 2012-09-12 ENCOUNTER — Inpatient Hospital Stay (EMERGENCY_DEPARTMENT_HOSPITAL)
Admission: AD | Admit: 2012-09-12 | Discharge: 2012-09-12 | Disposition: A | Discharge status: Home / Self Care | Payer: Medicaid Other | Source: Ambulatory Visit | Attending: Family Medicine | Admitting: Family Medicine

## 2012-09-12 ENCOUNTER — Encounter (HOSPITAL_COMMUNITY): Payer: Self-pay | Admitting: *Deleted

## 2012-09-12 DIAGNOSIS — O479 False labor, unspecified: Secondary | ICD-10-CM

## 2012-09-12 DIAGNOSIS — O471 False labor at or after 37 completed weeks of gestation: Secondary | ICD-10-CM

## 2012-09-12 LAB — URINE CULTURE
Colony Count: NO GROWTH
Organism ID, Bacteria: NO GROWTH

## 2012-09-12 LAB — CBC
HCT: 36.9 % (ref 36.0–46.0)
MCHC: 35 g/dL (ref 30.0–36.0)
RDW: 12.9 % (ref 11.5–15.5)

## 2012-09-12 MED ORDER — LACTATED RINGERS IV SOLN: 500.0000 mL | INTRAVENOUS | Status: DC | PRN

## 2012-09-12 MED ORDER — CITRIC ACID-SODIUM CITRATE 334-500 MG/5ML PO SOLN: 30.0000 mL | ORAL | Status: DC | PRN

## 2012-09-12 MED ORDER — OXYCODONE-ACETAMINOPHEN 5-325 MG PO TABS: 1.0000 | ORAL_TABLET | Freq: Four times a day (QID) | ORAL | Status: DC | PRN

## 2012-09-12 MED ORDER — ACETAMINOPHEN 325 MG PO TABS: 650.0000 mg | ORAL_TABLET | ORAL | Status: DC | PRN

## 2012-09-12 MED ORDER — LACTATED RINGERS IV SOLN
INTRAVENOUS | Status: DC
  Administered 2012-09-12: 21:00:00 via INTRAVENOUS

## 2012-09-12 MED ORDER — FLEET ENEMA 7-19 GM/118ML RE ENEM: 1.0000 | ENEMA | RECTAL | Status: DC | PRN

## 2012-09-12 MED ORDER — OXYCODONE-ACETAMINOPHEN 5-325 MG PO TABS: 1.0000 | ORAL_TABLET | ORAL | Status: DC | PRN

## 2012-09-12 MED ORDER — IBUPROFEN 600 MG PO TABS
600.0000 mg | ORAL_TABLET | Freq: Four times a day (QID) | ORAL | Status: DC | PRN
  Administered 2012-09-12: 600 mg via ORAL
  Filled 2012-09-12: qty 1

## 2012-09-12 MED ORDER — LIDOCAINE HCL (PF) 1 % IJ SOLN
30.0000 mL | INTRAMUSCULAR | Status: AC | PRN
  Administered 2012-09-12: 30 mL via SUBCUTANEOUS
  Filled 2012-09-12 (×2): qty 30

## 2012-09-12 MED ORDER — OXYTOCIN BOLUS FROM INFUSION
500.0000 mL | INTRAVENOUS | Status: DC
  Administered 2012-09-12: 500 mL via INTRAVENOUS

## 2012-09-12 MED ORDER — OXYTOCIN 40 UNITS IN LACTATED RINGERS INFUSION - SIMPLE MED
62.5000 mL/h | INTRAVENOUS | Status: DC
  Filled 2012-09-12: qty 1000

## 2012-09-12 MED ORDER — ONDANSETRON HCL 4 MG/2ML IJ SOLN: 4.0000 mg | Freq: Four times a day (QID) | INTRAMUSCULAR | Status: DC | PRN

## 2012-09-12 NOTE — Progress Notes (Signed)
Discussion with patient and family member with assistance from [employer], that the doctor advises that she go home and return if contractions become closer. Patient states she is in a lot of pain. Knowledged patient's pain, but informed her that her contractions are not very close and that her cervix has made very little change. Informed patient that she may feel better in her home where she can eat and move about freely. Patient keeps saying that she is in a lot of pain. Person with her states that she has noticed that her pain has increased. Offered patient to remain and be examined in 1 hour after last exam. EFM off. Patient up to BR and up in room.

## 2012-09-12 NOTE — MAU Note (Signed)
Pt states uc began yesterday. Are every 5 mins. States some leaking fluid, denies vaginal bleeding. States good FM.

## 2012-09-12 NOTE — MAU Provider Note (Signed)
Chart reviewed and agree with management and plan.  

## 2012-09-12 NOTE — MAU Provider Note (Signed)
Chief Complaint:  Labor Eval  First Provider Initiated Contact with Patient 09/12/12 [phone]     HPI: Brooke Perry is a 22 y.o. G1P0 at [redacted]w[redacted]d who presents to maternity admissions reporting very 5 minutes and leaking a small amount of clear-white fluid since 11:30 PM. Denies vaginal bleeding. Good fetal movement.   Pregnancy Course: Uncomplicated.  Past Medical History: Past Medical History  Diagnosis Date  . GERD (gastroesophageal reflux disease)     Past obstetric history: OB History  Gravida Para Term Preterm AB SAB TAB Ectopic Multiple Living  1             # Outcome Date GA Lbr Len/2nd Weight Sex Delivery Anes PTL Lv  1 CUR               Past Surgical History: Past Surgical History  Procedure Laterality Date  . No past surgeries       Family History: History reviewed. No pertinent family history.  Social History: History  Substance Use Topics  . Smoking status: Never Smoker   . Smokeless tobacco: Never Used  . Alcohol Use: No    Allergies: No Known Allergies  Meds:  Prescriptions prior to admission  Medication Sig Dispense Refill  . acetaminophen (TYLENOL) 325 MG tablet Take 2 tablets (650 mg total) by mouth every 6 (six) hours as needed for pain.  60 tablet  0  . cephALEXin (KEFLEX) 500 MG capsule Take 1 capsule (500 mg total) by mouth 4 (four) times daily.  40 capsule  0  . Diclofenac Sodium 3 % GEL Place 0.5-1 g onto the skin every 6 (six) hours as needed.  50 g  0  . famotidine (PEPCID) 20 MG tablet Take 1 tablet (20 mg total) by mouth 2 (two) times daily.  60 tablet  1  . Prenatal Vit-Fe Fumarate-FA (PRENATAL MULTIVITAMIN) TABS Take 1 tablet by mouth daily.  30 tablet  12    ROS: Pertinent findings in history of present illness.  Physical Exam  Blood pressure 106/56, pulse 74, temperature 97.9 F (36.6 C), temperature source Oral, resp. rate 18, height 5\' 3"  (1.6 m), weight 49.805 kg (109 lb 12.8 oz), last menstrual period 11/29/2011, SpO2  100.00%. GENERAL: Well-developed, well-nourished female in mild distress with contractions.  HEENT: normocephalic HEART: normal rate RESP: normal effort ABDOMEN: Soft, non-tender, gravid appropriate for gestational age EXTREMITIES: Nontender, no edema NEURO: alert and oriented SPECULUM EXAM: NEFG, physiologic discharge, negative pool, no blood, cervix clean Dilation: 1 Effacement (%): 60 Cervical Position: Posterior Station: -2 Presentation: Vertex Exam by:: Ivonne Andrew CNM  FHT:  Baseline 110 , moderate variability, accelerations present, no decelerations Contractions: irregular, mild-moderate    Labs: Results for orders placed during the hospital encounter of 09/12/12 (from the past 24 hour(s))  OB RESULTS CONSOLE GBS     Status: None   Collection Time    09/12/12 12:00 AM      Result Value Range   GBS Negative     Negative fern  Imaging:  No results found.  MAU Course:   Assessment: 1. False labor after 37 completed weeks of gestation     Plan: Discharge home in stable condition. Labor precautions and fetal kick counts     Follow-up Information   Follow up with [facility] On 09/19/2012.   Specialty:  Obstetrics and Gynecology   Contact information:   [address] [place] [address] [phone]      Follow up with [facility] [place] [facility]. (As needed if symptoms worsen)    Contact information:   [address] [id number] [place] [address] [phone]       Medication List         acetaminophen 325 MG tablet  Commonly known as:  TYLENOL  Take 2 tablets (650 mg total) by mouth every 6 (six) hours as needed for pain.     cephALEXin 500 MG capsule  Commonly known as:  KEFLEX  Take 1 capsule (500 mg total) by mouth 4 (four) times daily.     Diclofenac Sodium 3 % Gel  Place 0.5-1 g onto the skin every 6 (six) hours as needed.     famotidine 20 MG tablet  Commonly known as:  PEPCID   Take 1 tablet (20 mg total) by mouth 2 (two) times daily.     prenatal multivitamin Tabs tablet  Take 1 tablet by mouth daily.        [place], [place] 09/12/2012 2:37 AM

## 2012-09-12 NOTE — MAU Note (Signed)
Was seen in MAU last night. States UC's have continued all night. No blood or water leakage

## 2012-09-12 NOTE — H&P (Signed)
Brooke Perry is a 22 y.o. female G1P0 with IUP at [redacted]w[redacted]d presenting for labor.   Pt presents this evening with regular contractions and some leaking of fluid about 3 hours ago.  Was seen last night in MAU at 1am and was 1cm, then seen again this AM and was 3cm and unchanged in 4 hours and sent home. Since then, contractions have continued and strengthened.  Husband states that her water broke sometime this afternoon but they are not sure exactly what time.    P[facility]are at [facility] since 12 wks. Normal 1st trim screen, normal [place]. Normal labs. Neg GBS.    Prenatal History/Complications:  Past Medical History: Past Medical History  Diagnosis Date  . GERD (gastroesophageal reflux disease)     Past Surgical History: Past Surgical History  Procedure Laterality Date  . No past surgeries      Obstetrical History: OB History   Grav Para Term Preterm Abortions TAB SAB Ect Mult Living   1         0      Social History: History   Social History  . Marital Status: Married    Spouse Name: N/A    Number of Children: N/A  . Years of Education: N/A   Social History Main Topics  . Smoking status: Never Smoker   . Smokeless tobacco: Never Used  . Alcohol Use: No  . Drug Use: No  . Sexual Activity: Yes   Other Topics Concern  . None   Social History Narrative  . None    Family History: No family history on file.  Allergies: No Known Allergies  Prescriptions prior to admission  Medication Sig Dispense Refill  . acetaminophen (TYLENOL) 325 MG tablet Take 2 tablets (650 mg total) by mouth every 6 (six) hours as needed for pain.  60 tablet  0  . Diclofenac Sodium 3 % GEL Place 0.5-1 g onto the skin every 6 (six) hours as needed.  50 g  0  . famotidine (PEPCID) 20 MG tablet Take 1 tablet (20 mg total) by mouth 2 (two) times daily.  60 tablet  1  . Prenatal Vit-Fe Fumarate-FA (PRENATAL MULTIVITAMIN) TABS Take 1 tablet by mouth daily.  30 tablet  12     Review of Systems    Constitutional: Negative for fever, chills, weight loss, malaise/fatigue and diaphoresis.  HENT: Negative for hearing loss, ear pain, nosebleeds, congestion, sore throat, neck pain, tinnitus and ear discharge.   Eyes: Negative for blurred vision, double vision, photophobia, pain, discharge and redness.  Respiratory: Negative for cough, hemoptysis, sputum production, shortness of breath, wheezing and stridor.   Cardiovascular: Negative for chest pain, palpitations, orthopnea,  leg swelling  Gastrointestinal: Positive for abdominal pain. Negative for heartburn, nausea, vomiting, diarrhea, constipation, blood in stool Genitourinary: Negative for dysuria, urgency, frequency, hematuria and flank pain.  Musculoskeletal: Negative for myalgias, back pain, joint pain and falls.  Skin: Negative for itching and rash.  Neurological: Negative for dizziness, tingling, tremors, sensory change, speech change, focal weakness, seizures, loss of consciousness, weakness and headaches.  Endo/Heme/Allergies: Negative for environmental allergies and polydipsia. Does not bruise/bleed easily.  Psychiatric/Behavioral: Negative for depression, suicidal ideas, hallucinations, memory loss and substance abuse. The patient is not nervous/anxious and does not have insomnia.       Blood pressure 133/80, pulse 91, temperature 98.4 F (36.9 C), temperature source Oral, resp. rate 20, last menstrual period 11/29/2011. General appearance: alert, cooperative and in pain with contractions Lungs: clear to auscultation  bilaterally Heart: regular rate and rhythm Abdomen: soft, non-tender; bowel sounds normal Pelvic: 6/90/-1 Extremities: Homans sign is negative, no sign of DVT Presentation: cephalic Fetal monitoringBaseline: 125 bpm, Variability: Good {> 6 bpm), Accelerations: Reactive and Decelerations: Absent Uterine activity regular 4-40min  Dilation: 6 Effacement (%): 90 Station: -1 Exam by:: dr Reola Calkins   Prenatal  labs: ABO, Rh: --/--/A NEG (01/14 1400) Antibody: NEG (01/14 1400) Rubella: 0.37 (02/26 0920) RPR: NON REAC (06/18 1121)  HBsAg: NEGATIVE (02/26 0920)  HIV: NON REACTIVE (02/26 0920)  GBS: Negative (09/10 0000)  1 hr Glucola 87 Genetic screening normal first trimester screen Anatomy US normal- female   Assessment: Brooke Perry is a 22 y.o. G1P0 with an IUP at [redacted]w[redacted]d presenting for labor  Plan: 1) admit to [facility] - routine orders - pt undecided about epidural but may have if she would like - will augment with pitocin if needed  2) FWB - cat I tracing -GBS neg -EFW 7lbs   3) anticipate SVD   Brooke Osorto L, MD 09/12/2012, 9:08 PM

## 2012-09-13 ENCOUNTER — Encounter (HOSPITAL_COMMUNITY): Payer: Self-pay

## 2012-09-13 ENCOUNTER — Encounter: Payer: Self-pay | Admitting: Obstetrics and Gynecology

## 2012-09-13 LAB — CBC
HCT: 31.5 % — ABNORMAL LOW (ref 36.0–46.0)
Hemoglobin: 11.2 g/dL — ABNORMAL LOW (ref 12.0–15.0)
MCHC: 35.6 g/dL (ref 30.0–36.0)
MCV: 87.5 fL (ref 78.0–100.0)

## 2012-09-13 MED ORDER — LANOLIN HYDROUS EX OINT: TOPICAL_OINTMENT | CUTANEOUS | Status: DC | PRN

## 2012-09-13 MED ORDER — MEASLES, MUMPS & RUBELLA VAC ~~LOC~~ INJ
0.5000 mL | INJECTION | Freq: Once | SUBCUTANEOUS | Status: AC
  Administered 2012-09-14: 0.5 mL via SUBCUTANEOUS
  Filled 2012-09-13 (×2): qty 0.5

## 2012-09-13 MED ORDER — SIMETHICONE 80 MG PO CHEW: 80.0000 mg | CHEWABLE_TABLET | ORAL | Status: DC | PRN

## 2012-09-13 MED ORDER — DIBUCAINE 1 % RE OINT: 1.0000 "application " | TOPICAL_OINTMENT | RECTAL | Status: DC | PRN

## 2012-09-13 MED ORDER — TETANUS-DIPHTH-ACELL PERTUSSIS 5-2.5-18.5 LF-MCG/0.5 IM SUSP
0.5000 mL | Freq: Once | INTRAMUSCULAR | Status: AC
  Administered 2012-09-13: 0.5 mL via INTRAMUSCULAR

## 2012-09-13 MED ORDER — ONDANSETRON HCL 4 MG PO TABS: 4.0000 mg | ORAL_TABLET | ORAL | Status: DC | PRN

## 2012-09-13 MED ORDER — IBUPROFEN 600 MG PO TABS
600.0000 mg | ORAL_TABLET | Freq: Four times a day (QID) | ORAL | Status: DC
  Administered 2012-09-13 – 2012-09-14 (×7): 600 mg via ORAL
  Filled 2012-09-13 (×5): qty 1

## 2012-09-13 MED ORDER — OXYCODONE-ACETAMINOPHEN 5-325 MG PO TABS
1.0000 | ORAL_TABLET | ORAL | Status: DC | PRN
  Administered 2012-09-13 (×2): 1 via ORAL
  Filled 2012-09-13 (×2): qty 1

## 2012-09-13 MED ORDER — BENZOCAINE-MENTHOL 20-0.5 % EX AERO: 1.0000 "application " | INHALATION_SPRAY | CUTANEOUS | Status: DC | PRN

## 2012-09-13 MED ORDER — DIPHENHYDRAMINE HCL 25 MG PO CAPS: 25.0000 mg | ORAL_CAPSULE | Freq: Four times a day (QID) | ORAL | Status: DC | PRN

## 2012-09-13 MED ORDER — ZOLPIDEM TARTRATE 5 MG PO TABS: 5.0000 mg | ORAL_TABLET | Freq: Every evening | ORAL | Status: DC | PRN

## 2012-09-13 MED ORDER — PRENATAL MULTIVITAMIN CH
1.0000 | ORAL_TABLET | Freq: Every day | ORAL | Status: DC
  Administered 2012-09-13 – 2012-09-14 (×2): 1 via ORAL
  Filled 2012-09-13 (×2): qty 1

## 2012-09-13 MED ORDER — SENNOSIDES-DOCUSATE SODIUM 8.6-50 MG PO TABS: 2.0000 | ORAL_TABLET | Freq: Every day | ORAL | Status: DC

## 2012-09-13 MED ORDER — ONDANSETRON HCL 4 MG/2ML IJ SOLN: 4.0000 mg | INTRAMUSCULAR | Status: DC | PRN

## 2012-09-13 MED ORDER — WITCH HAZEL-GLYCERIN EX PADS: 1.0000 "application " | MEDICATED_PAD | CUTANEOUS | Status: DC | PRN

## 2012-09-13 NOTE — Progress Notes (Signed)
0050 Pt speaks Arabic.  Interpreter called via speaker phone.  Reviewed admission information and answered any questions in Arabic.  Will place breakfast for am per patient's request.  Vernona Rieger from nursery present and spoke with patient via interpretor also.  Dahlia Byes Boschen

## 2012-09-13 NOTE — Progress Notes (Signed)
Interpreter called to communicate with patient.  RN received patients lunch, dinner and breakfast orders and placed them at the times patient requested(1400, 2000, 0900).  Breastfeeding teaching done and RN helped patient with breastfeeding.  Patient stated she would like flu shot via interpreter but would like to ask husband about MMR.

## 2012-09-13 NOTE — Lactation Note (Signed)
This note was copied from the chart of Brooke Kewanna Kasprzak. Lactation Consultation Note  Patient Name: Brooke Perry [id number] Date: 09/13/2012  [facility] Interpreter used for breastfeeding education. Mother states baby has been sleepy when she has tried to feed him. RN present and she reports seeing the baby latch and feed well. Taught mother hand expression and colostrum was esiily expressed. Mother demonstrates ability to latch and confidence. Pt's friend is supportive. Several attempts to feed baby. Mother verbalized understanding with interpreter on the phone and did not have additional questions. Mother answered her cell phone several times in the visit and called someone. Baby remains at breast, showing slight cues and effort to feed. Mother to keep baby skin to skin. LC to follow tomorrow as needed. Handout given. Mother speaks limited english.   Maternal Data Formula Feeding for Exclusion: No  Feeding Feeding Type: Breast Milk Length of feed: 0 min  LATCH Score/Interventions Intervention(s): Skin to skin Intervention(s): Assist with latch  Audible Swallowing: None (fell asleep, no latch)  Type of Nipple: Everted at rest and after stimulation  Comfort (Breast/Nipple): Soft / non-tender     Hold (Positioning): No assistance needed to correctly position infant at breast. Intervention(s): Skin to skin     Lactation Tools Discussed/Used     Consult Status Consult Status: Follow-up Date: 09/14/12 Follow-up type: In-patient    Christella Hartigan M 09/13/2012, 4:37 PM

## 2012-09-13 NOTE — Progress Notes (Signed)
Post Partum Day 1 Subjective: no complaints, up ad lib, voiding and tolerating PO  Objective: Blood pressure 107/65, pulse 75, temperature 98.2 F (36.8 C), temperature source Oral, resp. rate 18, height 5\' 3"  (1.6 m), weight 49.442 kg (109 lb), last menstrual period 11/29/2011, SpO2 98.00%, unknown if currently breastfeeding.  Physical Exam:  General: alert, cooperative and no distress Lochia: appropriate Uterine Fundus: firm Incision: na DVT Evaluation: No evidence of DVT seen on physical exam.   Recent Labs  09/12/12 2100 09/13/12 0600  HGB 12.9 11.2*  HCT 36.9 31.5*    Assessment/Plan: Plan for discharge tomorrow and Breastfeeding. Will have lactation come by also.  Pt undecided on contraception and wants to talk to her husband.   Will re-address tomorrow.    LOS: 1 day   Brooke Perry L 09/13/2012, 7:45 AM

## 2012-09-14 NOTE — Discharge Summary (Signed)
  Obstetric Discharge Summary Reason for Admission: onset of labor Prenatal Procedures: ultrasound Intrapartum Procedures: spontaneous vaginal delivery Postpartum Procedures: none Complications-Operative and Postpartum: none  Hospital Course: She presented in early labor with SROM; had an uncomplicated vaginal delivery.  Is breastfeeding well.  No c/o.  Plans DMPA in 2 weeks at [facility]  Delivery Note At 10:30 PM a viable female was delivered via Vaginal, Spontaneous Delivery (Presentation: Right Occiput Anterior).  APGAR: 9, 9; weight 7 lb 1.2 oz (3209 g).   Placenta status: Intact, Spontaneous.  Cord: 3 vessels with the following complications: None.   Anesthesia: Local  Episiotomy: None Lacerations: 1st degree;Vaginal Suture Repair: n/a Est. Blood Loss (mL): 300  Mom to postpartum.  Baby to nursery-stable.  CRESENZO-DISHMAN,Nezar Buckles 09/14/2012, 7:53 AM     H/H: Lab Results  Component Value Date/Time   HGB 11.2* 09/13/2012  6:00 AM   HCT 31.5* 09/13/2012  6:00 AM      Discharge Diagnoses: Term Pregnancy-delivered  Discharge Information: Date: 07/15/2010 Activity: pelvic rest Diet: routine  Medications: None Breast feeding:  Yes Condition: stable Instructions: refer to handout Discharge to: home    Future Appointments Provider Department Dept Phone   10/19/2012 9:30 AM Deirdre Colin Mulders, CNM [facility] [phone]       Medication List         acetaminophen 325 MG tablet  Commonly known as:  TYLENOL  Take 2 tablets (650 mg total) by mouth every 6 (six) hours as needed for pain.     Diclofenac Sodium 3 % Gel  Place 0.5-1 g onto the skin every 6 (six) hours as needed.     famotidine 20 MG tablet  Commonly known as:  PEPCID  Take 1 tablet (20 mg total) by mouth 2 (two) times daily.     prenatal multivitamin Tabs tablet  Take 1 tablet by mouth daily.         CRESENZO-DISHMAN,Israel Werts 09/14/2012,7:53 AM

## 2012-09-15 ENCOUNTER — Other Ambulatory Visit (HOSPITAL_COMMUNITY): Payer: Self-pay | Admitting: Family Medicine

## 2012-09-19 ENCOUNTER — Encounter: Payer: Self-pay | Admitting: Advanced Practice Midwife

## 2012-09-24 NOTE — Discharge Summary (Signed)
Attestation of Attending Supervision of Advanced Practitioner (CNM/NP): Evaluation and management procedures were performed by the Advanced Practitioner under my supervision and collaboration. I have reviewed the Advanced Practitioner's note and chart, and I agree with the management and plan.  Raysha Tilmon H. 1:56 PM   

## 2012-09-26 ENCOUNTER — Emergency Department (HOSPITAL_COMMUNITY)
Admission: EM | Admit: 2012-09-26 | Discharge: 2012-09-27 | Disposition: A | Discharge status: Home / Self Care | Payer: Medicaid Other | Attending: Emergency Medicine | Admitting: Emergency Medicine

## 2012-09-26 ENCOUNTER — Encounter (HOSPITAL_COMMUNITY): Payer: Self-pay | Admitting: Emergency Medicine

## 2012-09-26 DIAGNOSIS — R0609 Other forms of dyspnea: Secondary | ICD-10-CM | POA: Insufficient documentation

## 2012-09-26 DIAGNOSIS — O99345 Other mental disorders complicating the puerperium: Secondary | ICD-10-CM | POA: Insufficient documentation

## 2012-09-26 DIAGNOSIS — R0989 Other specified symptoms and signs involving the circulatory and respiratory systems: Secondary | ICD-10-CM | POA: Insufficient documentation

## 2012-09-26 DIAGNOSIS — O9089 Other complications of the puerperium, not elsewhere classified: Secondary | ICD-10-CM

## 2012-09-26 DIAGNOSIS — F53 Postpartum depression: Secondary | ICD-10-CM

## 2012-09-26 DIAGNOSIS — K219 Gastro-esophageal reflux disease without esophagitis: Secondary | ICD-10-CM | POA: Insufficient documentation

## 2012-09-26 DIAGNOSIS — Z79899 Other long term (current) drug therapy: Secondary | ICD-10-CM | POA: Insufficient documentation

## 2012-09-26 DIAGNOSIS — Z3202 Encounter for pregnancy test, result negative: Secondary | ICD-10-CM | POA: Insufficient documentation

## 2012-09-26 DIAGNOSIS — Z603 Acculturation difficulty: Secondary | ICD-10-CM

## 2012-09-26 LAB — URINALYSIS, ROUTINE W REFLEX MICROSCOPIC
Bilirubin Urine: NEGATIVE
Glucose, UA: NEGATIVE mg/dL
Ketones, ur: NEGATIVE mg/dL
Protein, ur: NEGATIVE mg/dL

## 2012-09-26 LAB — CBC
HCT: 38.4 % (ref 36.0–46.0)
MCHC: 35.7 g/dL (ref 30.0–36.0)
MCV: 88.5 fL (ref 78.0–100.0)
Platelets: 303 10*3/uL (ref 150–400)
RDW: 12.2 % (ref 11.5–15.5)
WBC: 5.6 10*3/uL (ref 4.0–10.5)

## 2012-09-26 LAB — RAPID URINE DRUG SCREEN, HOSP PERFORMED
Amphetamines: NOT DETECTED
Barbiturates: NOT DETECTED
Benzodiazepines: NOT DETECTED
Cocaine: NOT DETECTED
Opiates: NOT DETECTED
Tetrahydrocannabinol: NOT DETECTED

## 2012-09-26 LAB — COMPREHENSIVE METABOLIC PANEL
AST: 20 U/L (ref 0–37)
Albumin: 3.2 g/dL — ABNORMAL LOW (ref 3.5–5.2)
BUN: 11 mg/dL (ref 6–23)
Calcium: 9.6 mg/dL (ref 8.4–10.5)
Creatinine, Ser: 0.54 mg/dL (ref 0.50–1.10)
Total Protein: 7.4 g/dL (ref 6.0–8.3)

## 2012-09-26 LAB — SALICYLATE LEVEL: Salicylate Lvl: <2 mg/dL — ABNORMAL LOW (ref 2.8–20.0)

## 2012-09-26 MED ORDER — LORAZEPAM 2 MG/ML IJ SOLN
1.0000 mg | Freq: Once | INTRAMUSCULAR | Status: AC
  Administered 2012-09-26: 1 mg via INTRAVENOUS
  Filled 2012-09-26: qty 1

## 2012-09-26 MED ORDER — IBUPROFEN 200 MG PO TABS: 600.0000 mg | ORAL_TABLET | Freq: Three times a day (TID) | ORAL | Status: DC | PRN

## 2012-09-26 MED ORDER — ACETAMINOPHEN 325 MG PO TABS
650.0000 mg | ORAL_TABLET | Freq: Once | ORAL | Status: AC
  Administered 2012-09-26: 650 mg via ORAL
  Filled 2012-09-26: qty 2

## 2012-09-26 NOTE — ED Notes (Signed)
Cannot get urine.  Pt is nursing the baby.  Informed RN.

## 2012-09-26 NOTE — ED Notes (Signed)
Bed: WA09 Expected date:  Expected time:  Means of arrival:  Comments: EMS 22yo F, ?Behavioral Issues / refusing to communicate or follow commands

## 2012-09-26 NOTE — ED Provider Notes (Signed)
CSN: [id number]     Arrival date & time 09/26/12  2102 History   First MD Initiated Contact with Patient 09/26/12 2105     Chief Complaint  Patient presents with  . Medical Clearance   (Consider location/radiation/quality/duration/timing/severity/associated sxs/prior Treatment) HPI Comments: 22 year old female brought in to the emergency department via EMS with a complaint of abnormal behavior. Patient's husband called EMS because she was acting abnormal towards him. Patient woke up from sleep around 8:30 PM tonight asking her husband for water, he gave her water, she then asked for more and he would not give it to her. When he did not give her the water, she stopped talking and began to stare into space. She would not speak with the EMS. EMS notes multiple marks on her arms, however her husband would not address this issue. The patient came to the [place] 10 months ago from [place], gave birth 2 weeks ago. The baby is currently with her husband who has not yet arrived at the emergency department. Patient is not responding to questions, therefore qualifies as a level V caveat.  The history is provided by the EMS personnel.    Past Medical History  Diagnosis Date  . GERD (gastroesophageal reflux disease)    Past Surgical History  Procedure Laterality Date  . No past surgeries     History reviewed. No pertinent family history. History  Substance Use Topics  . Smoking status: Never Smoker   . Smokeless tobacco: Never Used  . Alcohol Use: No   OB History   Grav Para Term Preterm Abortions TAB SAB Ect Mult Living   1 1 1       1      Review of Systems  Unable to perform ROS: Patient nonverbal    Allergies  Review of patient's allergies indicates no known allergies.  Home Medications   Current Outpatient Rx  Name  Route  Sig  Dispense  Refill  . acetaminophen (TYLENOL) 325 MG tablet   Oral   Take 2 tablets (650 mg total) by mouth every 6 (six) hours as needed for pain.   60  tablet   0   . Diclofenac Sodium 3 % GEL   Transdermal   Place 0.5-1 g onto the skin every 6 (six) hours as needed.   50 g   0   . famotidine (PEPCID) 20 MG tablet   Oral   Take 1 tablet (20 mg total) by mouth 2 (two) times daily.   60 tablet   1   . Prenatal Vit-Fe Fumarate-FA (PRENATAL MULTIVITAMIN) TABS   Oral   Take 1 tablet by mouth daily.   30 tablet   12    BP 121/73  Pulse 70  Temp(Src) 98.7 F (37.1 C) (Oral)  Resp 38  SpO2 100%  LMP 11/29/2011 Physical Exam  Nursing note and vitals reviewed. Constitutional: She appears well-developed and well-nourished. No distress.  HENT:  Head: Normocephalic and atraumatic.  Mouth/Throat: Oropharynx is clear and moist.  Eyes: Conjunctivae and EOM are normal. Pupils are equal, round, and reactive to light.  Neck: Normal range of motion. Neck supple.  Cardiovascular: Normal rate, regular rhythm and normal heart sounds.   Pulmonary/Chest: Breath sounds normal. Tachypnea noted. She has no decreased breath sounds. She has no wheezes. She has no rhonchi. She has no rales.  Normal RR when not directly looking at patient, increased significantly when looking at her.  Abdominal: Soft. Normal appearance and bowel sounds are normal. She exhibits  no distension and no mass. There is no tenderness.  Genitourinary:  Patient wearing pad, no blood on pad.  Musculoskeletal: Normal range of motion. She exhibits no edema.  Neurological: She is alert. She has normal strength. No cranial nerve deficit or sensory deficit. GCS eye subscore is 4. GCS verbal subscore is 5. GCS motor subscore is 6.  Skin: Skin is warm and dry. She is not diaphoretic.  Multiple well-healed linear scars on bilateral forearms.  Psychiatric: Her mood appears anxious. She is withdrawn. She is noncommunicative.    ED Course  Procedures (including critical care time) Labs Review Labs Reviewed  CBC  COMPREHENSIVE METABOLIC PANEL  URINE RAPID DRUG SCREEN (HOSP  PERFORMED)  ETHANOL  URINALYSIS, ROUTINE W REFLEX MICROSCOPIC  SALICYLATE LEVEL   Imaging Review No results found.  MDM  No diagnosis found.   9:30 PM patient was seen and evaluated. She is not communicating. She appears in no distress, hyperventilating when you look directly at her, however when you glance away, she stops breathing heavily. Normal vital signs other than her respiratory rate. Shakes her head no when asking if she has pain. There are multiple well-healed scars on both of her forearms. Symptoms are most likely psychiatric related. Labs pending- cbc, CMP, UA, UDS, EtOH, salicylate level and acetaminophen level. She appears anxious, will give Ativan. Husband is not yet present. 10:06 PM Husband present. He states patient does not speak [place]. He is translating for patient. States around 5:00 PM tonight, patient began to act different. She is being followed by her OB/GYN for vaginal pain after delivery, denies any new symptoms. The marks on her arms he states are from cooking back in her home country. No history of depression. After patient was given Ativan, she is no longer hyperventilating, she is breast-feeding her baby. Labs pending. 12:02 AM Labs unremarkable other than alk phos 137, no old to compare. No abdominal pain. UA clear. She is resting comfortably. Husband states patient may be sad throughout the day when he leaves her for work. Will get psych consult, probable post partum depression. She is medically cleared but will remain on the main side of the ED as she is breast feeding. 12:43 AM ACT team aware, will evaluate patient.  Trevor Mace, PA-C 09/27/12 [phone]

## 2012-09-26 NOTE — ED Notes (Signed)
Brought in by EMS from home with c/o "abnormal behavior"; pt's husband called EMS.  Per EMS, pt's husband reported that pt exhibits "abnormal/weird behavior", onset  tonight; per husband, pt came to the [place] 10 months ago, delivered a baby 2 weeks ago, pt woke up at around 2030 and yelled "water, water!"--- pt was given water by husband, then pt suddenly stopped talking, staring into space.  Per EMS, pt has not responded verbally at all but moves body freely.

## 2012-09-27 DIAGNOSIS — O99345 Other mental disorders complicating the puerperium: Secondary | ICD-10-CM

## 2012-09-27 NOTE — Consult Note (Signed)
[facility] Face-to-Face Psychiatry Consult   Reason for Consult:  22 year old female 2 weeks postpartum brought to ED via EMS after her husband called 911 reporting abnormal behavior.   Referring Physician:  Lauralynn Perry is an 22 y.o. female.  Assessment: AXIS I:  postpartum symptoms- fatigue, decreased appetite  AXIS II:  Deferred AXIS III:   Past Medical History  Diagnosis Date  . GERD (gastroesophageal reflux disease)    AXIS IV:  problems with primary support group AXIS V:  61-70 mild symptoms  Plan:  No evidence of imminent risk to self or others at present.   Patient does not meet criteria for psychiatric inpatient admission. Discussed crisis plan, support from social network, calling 911, coming to the Emergency Department, and calling Suicide Hotline.  Subjective:   Brooke Perry is a 22 y.o. female patient brought to [facility] after her husband called reporting abnormal behavior.  She 2 weeks postpartum and her husband is at the bedside.  Language barrier as patient does not speak english. Writer communicated with patient first using patient's husband as interpreter, later Clinical research associate called [place] [employer] to interpret, patient speaks Arabic.  Patient is calm, flat, and withdrawn.  Upon Clinical research associate entering the room baby begins to cry and patient seems disinterested in caring for the baby at that time.  Patient's husband reports that she was exhibiting behavior that appeared to be abnormal to him.  He explains that afternoon 09/26/12 patient asked him for some water, he then gave her a bottle of water.  She drank the water he gave her then she proceeded to ask for some more water and he refused to get her more water.  He reports that patient then began speaking slowly, stuck her tongue outside of her mouth several times, and it appeared as if she was having difficulty speaking.  She had a banana and a soda this morning for breakfast and did not eat again until 3pm.  She reports that she has not  been feeling well since prior to delivery > 2 weeks ago.  She reports intermittent chills, increased thirst, and decreased appetite.  Denies known fever, breast pain, or fullness.  She denies SI, HI, or AVH.  She also denies feelings of hopelessness, worthlessness, or guilt.  She denies sadness, anxiety, or anger.  She admits to not sleeping well at night because the baby is not sleeping at night but instead is sleeping during the day.  According to patient she is sleeping "some" when the baby is resting, she sleeps approximately 1 1/2 hours during the day.  She states that her hobbies prior to delivery/early pregnancy included "going outside".  She has not been outside since the baby was born and states that this is because she is supposed to stay in the home until at least 4 weeks postpartum.  She is experiencing joint pain present since delivery.  She is also taking Cephelexin 500mg  po TID, her husband states that she is taking this medication for pain, upon writer informing patient's husband indications for antibiotics he then explains that she is taking the medication for her incision.  She has Postpartum f/u appointment at [facility] clinics on 10/19/12.        Kennyth Lose interpreter # [phone]  Past Psychiatric History: Past Medical History  Diagnosis Date  . GERD (gastroesophageal reflux disease)     reports that she has never smoked. She has never used smokeless tobacco. She reports that she does not drink alcohol or use illicit drugs.  History reviewed. No pertinent family history.         Allergies:  No Known Allergies  Objective: Blood pressure 115/75, pulse 60, temperature 98.3 F (36.8 C), temperature source Oral, resp. rate 18, last menstrual period 11/29/2011, SpO2 100.00%, unknown if currently breastfeeding.There is no weight on file to calculate BMI. Results for orders placed during the hospital encounter of 09/26/12 (from the past 72 hour(s))  CBC     Status: None   Collection Time     09/26/12  9:45 PM      Result Value Range   WBC 5.6  4.0 - 10.5 K/uL   RBC 4.34  3.87 - 5.11 MIL/uL   Hemoglobin 13.7  12.0 - 15.0 g/dL   HCT 34.7  42.5 - 95.6 %   MCV 88.5  78.0 - 100.0 fL   MCH 31.6  26.0 - 34.0 pg   MCHC 35.7  30.0 - 36.0 g/dL   RDW 38.7  56.4 - 33.2 %   Platelets 303  150 - 400 K/uL  COMPREHENSIVE METABOLIC PANEL     Status: Abnormal   Collection Time    09/26/12  9:45 PM      Result Value Range   Sodium 140  135 - 145 mEq/L   Potassium 3.8  3.5 - 5.1 mEq/L   Chloride 105  96 - 112 mEq/L   CO2 24  19 - 32 mEq/L   Glucose, Bld 91  70 - 99 mg/dL   BUN 11  6 - 23 mg/dL   Creatinine, Ser 9.51  0.50 - 1.10 mg/dL   Calcium 9.6  8.4 - 88.4 mg/dL   Total Protein 7.4  6.0 - 8.3 g/dL   Albumin 3.2 (*) 3.5 - 5.2 g/dL   AST 20  0 - 37 U/L   ALT 19  0 - 35 U/L   Alkaline Phosphatase 137 (*) 39 - 117 U/L   Total Bilirubin 0.2 (*) 0.3 - 1.2 mg/dL   GFR calc non Af Amer >90  >90 mL/min   GFR calc Af Amer >90  >90 mL/min   Comment: (NOTE)     The eGFR has been calculated using the CKD EPI equation.     This calculation has not been validated in all clinical situations.     eGFR's persistently <90 mL/min signify possible Chronic Kidney     Disease.  ETHANOL     Status: None   Collection Time    09/26/12  9:45 PM      Result Value Range   Alcohol, Ethyl (B) <11  0 - 11 mg/dL   Comment:            LOWEST DETECTABLE LIMIT FOR     SERUM ALCOHOL IS 11 mg/dL     FOR MEDICAL PURPOSES ONLY  SALICYLATE LEVEL     Status: Abnormal   Collection Time    09/26/12  9:45 PM      Result Value Range   Salicylate Lvl <2.0 (*) 2.8 - 20.0 mg/dL  URINE RAPID DRUG SCREEN (HOSP PERFORMED)     Status: None   Collection Time    09/26/12 11:31 PM      Result Value Range   Opiates NONE DETECTED  NONE DETECTED   Cocaine NONE DETECTED  NONE DETECTED   Benzodiazepines NONE DETECTED  NONE DETECTED   Amphetamines NONE DETECTED  NONE DETECTED   Tetrahydrocannabinol NONE DETECTED  NONE  DETECTED   Barbiturates NONE DETECTED  NONE DETECTED  Comment:            DRUG SCREEN FOR MEDICAL PURPOSES     ONLY.  IF CONFIRMATION IS NEEDED     FOR ANY PURPOSE, NOTIFY LAB     WITHIN 5 DAYS.                LOWEST DETECTABLE LIMITS     FOR URINE DRUG SCREEN     Drug Class       Cutoff (ng/mL)     Amphetamine      1000     Barbiturate      200     Benzodiazepine   200     Tricyclics       300     Opiates          300     Cocaine          300     THC              50  URINALYSIS, ROUTINE W REFLEX MICROSCOPIC     Status: Abnormal   Collection Time    09/26/12 11:31 PM      Result Value Range   Color, Urine YELLOW  YELLOW   APPearance CLEAR  CLEAR   Specific Gravity, Urine 1.014  1.005 - 1.030   pH 7.5  5.0 - 8.0   Glucose, UA NEGATIVE  NEGATIVE mg/dL   Hgb urine dipstick SMALL (*) NEGATIVE   Bilirubin Urine NEGATIVE  NEGATIVE   Ketones, ur NEGATIVE  NEGATIVE mg/dL   Protein, ur NEGATIVE  NEGATIVE mg/dL   Urobilinogen, UA 0.2  0.0 - 1.0 mg/dL   Nitrite NEGATIVE  NEGATIVE   Leukocytes, UA NEGATIVE  NEGATIVE  URINE MICROSCOPIC-ADD ON     Status: None   Collection Time    09/26/12 11:31 PM      Result Value Range   RBC / HPF 0-2  <3 RBC/hpf  POCT PREGNANCY, URINE     Status: None   Collection Time    09/26/12 11:37 PM      Result Value Range   Preg Test, Ur NEGATIVE  NEGATIVE   Comment:            THE SENSITIVITY OF THIS     METHODOLOGY IS >24 mIU/mL   Labs reviewed, stable.  Current Facility-Administered Medications  Medication Dose Route Frequency Provider Last Rate Last Dose  . ibuprofen (ADVIL,MOTRIN) tablet 600 mg  600 mg Oral Q8H PRN Trevor Mace, PA-C       Current Outpatient Prescriptions  Medication Sig Dispense Refill  . cephALEXin (KEFLEX) 500 MG capsule Take 500 mg by mouth 4 (four) times daily.      . Prenatal Vit-Fe Fumarate-FA (PRENATAL MULTIVITAMIN) TABS Take 1 tablet by mouth daily.  30 tablet  12  . acetaminophen (TYLENOL) 325 MG tablet Take 2  tablets (650 mg total) by mouth every 6 (six) hours as needed for pain.  60 tablet  0  . Diclofenac Sodium 3 % GEL Place 0.5-1 g onto the skin every 6 (six) hours as needed.  50 g  0  . famotidine (PEPCID) 20 MG tablet Take 1 tablet (20 mg total) by mouth 2 (two) times daily.  60 tablet  1    Psychiatric Specialty Exam:     Blood pressure 115/75, pulse 60, temperature 98.3 F (36.8 C), temperature source Oral, resp. rate 18, last menstrual period 11/29/2011, SpO2 100.00%, unknown if currently breastfeeding.There is no  weight on file to calculate BMI.  General Appearance: Fairly Groomed  Patent attorney::  Minimal  Speech:  language barrier  Volume:  Normal  Mood:  Depressed  Affect:  Depressed and Flat  Thought Process:  language barrier; utilized Designer, television/film set  Orientation:  Full (Time, Place, and Person)  Thought Content:  WDL  Suicidal Thoughts:  No  Homicidal Thoughts:  No  Memory:  Immediate;   Fair  Judgement:  Fair  Insight:  Lacking  Psychomotor Activity:  Negative  Concentration:  Fair  Recall:  Fair  Akathisia:  Negative  Handed:    AIMS (if indicated):     Assets:  Physical Health Social Support  Sleep:      Treatment Plan Summary: 1) Not meeting inpatient criteria. 2) Patient to make appointment to be seen by Provider at [facility] clinics on 09/27/12 3) Discussed appropriate caloric intake and fluid intake  4) Will consult with EDP in regards to patient and d/c'ing patient if he agree that this is appropriate 5) Advised to report to ED with SI, HI, AVH, or worsening symptoms- Understanding verbalized by patient and her husband   Kizzie Fantasia CORI 09/27/2012 1:20 AM  Reviewed the information documented and agree with the treatment plan.  Alwyn Cordner,JANARDHAHA R. 09/27/2012 12:57 PM

## 2012-09-27 NOTE — ED Notes (Signed)
Pt's husband and baby at bedside. Pt breastfeeding baby. Per pt's husband, pt's baby only breastfeeds, does not want milk formula.

## 2012-09-29 NOTE — ED Provider Notes (Signed)
Medical screening examination/treatment/procedure(s) were performed by non-physician practitioner and as supervising physician I was immediately available for consultation/collaboration.  Flint Melter, MD 09/29/12 [phone]

## 2012-10-19 ENCOUNTER — Ambulatory Visit (INDEPENDENT_AMBULATORY_CARE_PROVIDER_SITE_OTHER): Payer: Medicaid Other | Admitting: Obstetrics and Gynecology

## 2012-10-19 DIAGNOSIS — Z3049 Encounter for surveillance of other contraceptives: Secondary | ICD-10-CM

## 2012-10-19 DIAGNOSIS — Z01812 Encounter for preprocedural laboratory examination: Secondary | ICD-10-CM

## 2012-10-19 DIAGNOSIS — Z30017 Encounter for initial prescription of implantable subdermal contraceptive: Secondary | ICD-10-CM

## 2012-10-19 MED ORDER — LEVONORGESTREL 20 MCG/24HR IU IUD: INTRAUTERINE_SYSTEM | Freq: Once | INTRAUTERINE | Status: DC

## 2012-10-19 MED ORDER — ETONOGESTREL 68 MG ~~LOC~~ IMPL
68.0000 mg | DRUG_IMPLANT | Freq: Once | SUBCUTANEOUS | Status: AC
  Administered 2012-10-19: 68 mg via SUBCUTANEOUS

## 2012-10-19 NOTE — Progress Notes (Signed)
Patient ID: Grisell Bissette, female   DOB: 28-Apr-1990, 22 y.o.   MRN: [id number]  GYNECOLOGY CLINIC PROCEDURE NOTE  Karessa Onorato is a 22 y.o. G1P1001 here for her postpartum visit.  She desires Nexplanon insertion today. No GYN concerns.   Attending Dr. Erin Fulling present for the procedure.   Nexplanon Insertion Procedure Patient was given informed consent, she signed consent form.  Patient does understand that irregular bleeding is a very common side effect of this medication.  Appropriate time out taken.  Patient's left arm was prepped and draped in the usual sterile fashion. Insertion area was measured and marked.  Patient was prepped with alcohol swab and then injected with 2.5 ml of 1% lidocaine.  She was prepped with betadine, Nexplanon removed from packaging,  Device confirmed in needle, then inserted full length of needle and withdrawn. Nexplanon was able to palpated in the patient's arm; patient palpated the insert herself. There was minimal blood loss.  Patient insertion site covered with guaze and a pressure bandage to reduce any bruising.  The patient tolerated the procedure well and was given post procedure instructions.   Everlene Other DO Family Medicine PGY-2

## 2012-10-19 NOTE — Addendum Note (Signed)
Addended by: Faythe Casa on: 10/19/2012 11:48 AM   Modules accepted: Orders

## 2012-10-19 NOTE — Progress Notes (Signed)
  Subjective:     Brooke Perry is a 22 y.o. female who presents for a postpartum visit. She is 5 weeks postpartum following a spontaneous vaginal delivery. I have fully reviewed the prenatal and intrapartum course. The delivery was at 40 gestational weeks. Outcome: spontaneous vaginal delivery. Anesthesia: local. Postpartum course has been uncomplicated. Baby's course has been uncomplicated. Baby is feeding by breast. Bleeding no bleeding. Bowel function is normal. Bladder function is normal. Patient is not sexually active. Contraception method is abstinence. Postpartum depression screening: negative. Bleeding stopped 2 days ago.  The following portions of the patient's history were reviewed and updated as appropriate: allergies, current medications, past family history, past medical history, past social history, past surgical history and problem list.  Review of Systems Pertinent items are noted in HPI.   Objective:    BP 103/66  Pulse 75  Temp(Src) 97.6 F (36.4 C) (Oral)  Ht 5\' 2"  (1.575 m)  Wt 96 lb (43.545 kg)  BMI 17.55 kg/m2  Breastfeeding? Yes  General:  alert, cooperative, appears stated age and no distress   Breasts:  inspection negative, no nipple discharge or bleeding, no masses or nodularity palpable  Lungs: clear to auscultation bilaterally  Heart:  regular rate and rhythm, S1, S2 normal, no murmur, click, rub or gallop  Abdomen: soft, non-tender; bowel sounds normal; no masses,  no organomegaly     Pelvic deferred                     Assessment:     5wks postpartum exam. Pap smear not done at today's visit.   Plan:    1. Contraception: Nexplanon to be inserted today 2. Continue PNV until 6 wks 3. Follow up in: 4 months for Pap  or as needed.

## 2012-10-19 NOTE — Progress Notes (Signed)
Patient ID: Brooke Perry, female   DOB: 20-Apr-1990, 22 y.o.   MRN: [id number] Attestation of Attending Supervision of Resident: Evaluation and management procedures were performed by the [facility] Resident under my direct supervision.  I have seen and examined the patient, reviewed the resident's note and chart, and I agree with the management and plan.  Anibal Henderson, M.D. 10/19/2012 3:32 PM

## 2012-10-19 NOTE — Patient Instructions (Signed)

## 2012-10-24 ENCOUNTER — Encounter: Payer: Self-pay | Admitting: *Deleted

## 2013-03-27 ENCOUNTER — Ambulatory Visit: Payer: Self-pay | Admitting: Family Medicine

## 2013-05-31 ENCOUNTER — Ambulatory Visit (INDEPENDENT_AMBULATORY_CARE_PROVIDER_SITE_OTHER): Payer: Medicaid Other | Admitting: Obstetrics & Gynecology

## 2013-05-31 VITALS — BP 104/62 | HR 69 | Temp 98.2°F

## 2013-05-31 DIAGNOSIS — Z309 Encounter for contraceptive management, unspecified: Secondary | ICD-10-CM

## 2013-05-31 NOTE — Progress Notes (Signed)
[employer] interpreter used ID [phone]. Patient here because she wants to ensure her nexplanon is in the right spot and would like the information regarding when it will expire and need to be replaced as she does not have this information from her visit in October (nexplanon placed at [facility] visit 10/19/12). Nexplanon palpated and assured patient it is in the correct spot, active, no problems with site. Letter given to patient informing her of date of expiration. Patient denies any other questions or concerns. No need to see provider.

## 2013-11-04 ENCOUNTER — Encounter (HOSPITAL_COMMUNITY): Payer: Self-pay | Admitting: Emergency Medicine

## 2013-12-26 ENCOUNTER — Encounter (HOSPITAL_COMMUNITY): Payer: Self-pay | Admitting: Emergency Medicine

## 2013-12-26 ENCOUNTER — Emergency Department (INDEPENDENT_AMBULATORY_CARE_PROVIDER_SITE_OTHER)
Admission: EM | Admit: 2013-12-26 | Discharge: 2013-12-26 | Disposition: A | Discharge status: Home / Self Care | Payer: Medicaid Other | Source: Home / Self Care

## 2013-12-26 DIAGNOSIS — N61 Inflammatory disorders of breast: Secondary | ICD-10-CM

## 2013-12-26 MED ORDER — IBUPROFEN 800 MG PO TABS: 800.0000 mg | ORAL_TABLET | Freq: Three times a day (TID) | ORAL | Status: DC | PRN

## 2013-12-26 MED ORDER — CEPHALEXIN 500 MG PO CAPS: 500.0000 mg | ORAL_CAPSULE | Freq: Four times a day (QID) | ORAL | Status: DC

## 2013-12-26 NOTE — ED Provider Notes (Signed)
CSN: [id number]     Arrival date & time 12/26/13  1503 History   None    Chief Complaint  Patient presents with  . Breast Pain   (Consider location/radiation/quality/duration/timing/severity/associated sxs/prior Treatment) HPI  She is a 23 year old woman here for evaluation of left breast pain. This started yesterday. The pain is diffuse in the breast, but worse in the inferior outer quadrant. She also reports subjective fevers and headache. No nausea or vomiting. She is breast-feeding.  Past Medical History  Diagnosis Date  . GERD (gastroesophageal reflux disease)    Past Surgical History  Procedure Laterality Date  . No past surgeries     History reviewed. No pertinent family history. History  Substance Use Topics  . Smoking status: Never Smoker   . Smokeless tobacco: Never Used  . Alcohol Use: No   OB History    Gravida Para Term Preterm AB TAB SAB Ectopic Multiple Living   1 1 1       1      Review of Systems As in history of present illness Allergies  Review of patient's allergies indicates no known allergies.  Home Medications   Prior to Admission medications   Medication Sig Start Date End Date Taking? Authorizing Provider  acetaminophen (TYLENOL) 325 MG tablet Take 2 tablets (650 mg total) by mouth every 6 (six) hours as needed for pain. 05/17/12   Napoleon FormPamela Ferry, MD  cephALEXin (KEFLEX) 500 MG capsule Take 1 capsule (500 mg total) by mouth 4 (four) times daily. 12/26/13   Charm RingsErin J Abbegale Stehle, MD  Diclofenac Sodium 3 % GEL Place 0.5-1 g onto the skin every 6 (six) hours as needed. 05/17/12   Napoleon FormPamela Ferry, MD  famotidine (PEPCID) 20 MG tablet Take 1 tablet (20 mg total) by mouth 2 (two) times daily. 05/17/12   Napoleon FormPamela Ferry, MD  ibuprofen (ADVIL,MOTRIN) 800 MG tablet Take 1 tablet (800 mg total) by mouth every 8 (eight) hours as needed for moderate pain. 12/26/13   Charm RingsErin J Genelle Economou, MD  Prenatal Vit-Fe Fumarate-FA (PRENATAL MULTIVITAMIN) TABS Take 1 tablet by mouth daily. 08/01/12    Tereso NewcomerUgonna A Anyanwu, MD   BP 113/71 mmHg  Pulse 115  Temp(Src) 99.6 F (37.6 C) (Oral)  Resp 16  SpO2 96%  Breastfeeding? Yes Physical Exam  Constitutional: She appears well-developed and well-nourished. She appears distressed (tired appearing).  Cardiovascular: Normal rate.   Pulmonary/Chest: Effort normal. Left breast exhibits tenderness (inferior outer quadrant). Left breast exhibits no mass and no skin change.    ED Course  Procedures (including critical care time) Labs Review Labs Reviewed - No data to display  Imaging Review No results found.   MDM   1. Mastitis, left, acute    Although there is no erythema, her tenderness and fever are consistent with mastitis. Recommended continuing breast-feeding. Keflex 4 times a day for 10 days. Ibuprofen 800 mg every 8 hours as needed for pain. Apply ice packs as needed. Follow-up in 2-3 days if no improvement.    Charm RingsErin J Alysen Smylie, MD 12/26/13 [phone]

## 2013-12-26 NOTE — ED Notes (Addendum)
C/o  Left breast pain since yesterday with fever and headache.  Denies discharge.   No otc pain meds tried.    Pt is currently still breastfeeding.  Mw,cma

## 2013-12-26 NOTE — Discharge Instructions (Signed)
You have an infection of the breast. Continue to breastfeed. Take keflex 1 pill 4 times a day for 10 days. Use ibuprofen 800mg  every 8 hours as needed for pain. Apply ice packs as needed.  Follow up if no improvement in 2-3 days.

## 2014-01-23 ENCOUNTER — Emergency Department (HOSPITAL_COMMUNITY)
Admission: EM | Admit: 2014-01-23 | Discharge: 2014-01-23 | Disposition: A | Discharge status: Home / Self Care | Payer: 59 | Attending: Emergency Medicine | Admitting: Emergency Medicine

## 2014-01-23 ENCOUNTER — Encounter (HOSPITAL_COMMUNITY): Payer: Self-pay | Admitting: *Deleted

## 2014-01-23 DIAGNOSIS — L299 Pruritus, unspecified: Secondary | ICD-10-CM

## 2014-01-23 DIAGNOSIS — Z792 Long term (current) use of antibiotics: Secondary | ICD-10-CM | POA: Diagnosis not present

## 2014-01-23 DIAGNOSIS — Z79899 Other long term (current) drug therapy: Secondary | ICD-10-CM | POA: Diagnosis not present

## 2014-01-23 DIAGNOSIS — K219 Gastro-esophageal reflux disease without esophagitis: Secondary | ICD-10-CM | POA: Diagnosis not present

## 2014-01-23 DIAGNOSIS — L298 Other pruritus: Secondary | ICD-10-CM | POA: Diagnosis not present

## 2014-01-23 MED ORDER — PREDNISONE 20 MG PO TABS
60.0000 mg | ORAL_TABLET | Freq: Once | ORAL | Status: AC
  Administered 2014-01-23: 60 mg via ORAL
  Filled 2014-01-23: qty 3

## 2014-01-23 MED ORDER — PREDNISONE 20 MG PO TABS: 40.0000 mg | ORAL_TABLET | Freq: Every day | ORAL | Status: DC

## 2014-01-23 MED ORDER — FAMOTIDINE 20 MG PO TABS
20.0000 mg | ORAL_TABLET | Freq: Once | ORAL | Status: AC
  Administered 2014-01-23: 20 mg via ORAL
  Filled 2014-01-23: qty 1

## 2014-01-23 MED ORDER — LORATADINE 10 MG PO TABS
10.0000 mg | ORAL_TABLET | Freq: Once | ORAL | Status: AC
  Administered 2014-01-23: 10 mg via ORAL
  Filled 2014-01-23: qty 1

## 2014-01-23 MED ORDER — DIPHENHYDRAMINE HCL 25 MG PO CAPS: 25.0000 mg | ORAL_CAPSULE | Freq: Four times a day (QID) | ORAL | Status: DC | PRN

## 2014-01-23 MED ORDER — FAMOTIDINE 20 MG PO TABS: 20.0000 mg | ORAL_TABLET | Freq: Two times a day (BID) | ORAL | Status: DC

## 2014-01-23 NOTE — ED Provider Notes (Signed)
CSN: [id number]     Arrival date & time 01/23/14  1454 History  This chart was scribed for non-physician practitioner, Harle BattiestElizabeth Adara Kittle, NP-C, working with Toy BakerAnthony T Allen, MD by Charline BillsEssence Howell, ED Scribe. This patient was seen in room TR10C/TR10C and the patient's care was started at 3:35 PM.   Chief Complaint  Patient presents with  . Pruritis   The history is provided by the patient. A language interpreter was used.   HPI Comments: Brooke Perry is a 24 y.o. female who presents to the Emergency Department complaining of constant generalized itching sensation for the past 3 days. Pt reports that itching is so severe that it keeps her up at night. She has been treating with ibuprofen without relief. No h/o similar symptoms. No contacts at home with similar symptoms. Pt is currently nursing. No PCP   Past Medical History  Diagnosis Date  . GERD (gastroesophageal reflux disease)    Past Surgical History  Procedure Laterality Date  . No past surgeries     History reviewed. No pertinent family history. History  Substance Use Topics  . Smoking status: Never Smoker   . Smokeless tobacco: Never Used  . Alcohol Use: No   OB History    Gravida Para Term Preterm AB TAB SAB Ectopic Multiple Living   1 1 1       1      Review of Systems  Skin: Negative for rash.  All other systems reviewed and are negative.  Allergies  Review of patient's allergies indicates no known allergies.  Home Medications   Prior to Admission medications   Medication Sig Start Date End Date Taking? Authorizing Provider  acetaminophen (TYLENOL) 325 MG tablet Take 2 tablets (650 mg total) by mouth every 6 (six) hours as needed for pain. 05/17/12   Napoleon FormPamela Ferry, MD  cephALEXin (KEFLEX) 500 MG capsule Take 1 capsule (500 mg total) by mouth 4 (four) times daily. 12/26/13   Charm RingsErin J Honig, MD  Diclofenac Sodium 3 % GEL Place 0.5-1 g onto the skin every 6 (six) hours as needed. 05/17/12   Napoleon FormPamela Ferry, MD  famotidine  (PEPCID) 20 MG tablet Take 1 tablet (20 mg total) by mouth 2 (two) times daily. 05/17/12   Napoleon FormPamela Ferry, MD  ibuprofen (ADVIL,MOTRIN) 800 MG tablet Take 1 tablet (800 mg total) by mouth every 8 (eight) hours as needed for moderate pain. 12/26/13   Charm RingsErin J Honig, MD  Prenatal Vit-Fe Fumarate-FA (PRENATAL MULTIVITAMIN) TABS Take 1 tablet by mouth daily. 08/01/12   Tereso NewcomerUgonna A Anyanwu, MD   Triage Vitals: BP 115/85 mmHg  Pulse 91  Temp(Src) 97.8 F (36.6 C) (Oral)  Resp 20  SpO2 100% Physical Exam  Constitutional: She is oriented to person, place, and time. She appears well-developed and well-nourished. No distress.  HENT:  Head: Normocephalic and atraumatic.  Eyes: Conjunctivae and EOM are normal.  Neck: Neck supple.  Cardiovascular: Normal rate.   Pulmonary/Chest: Effort normal.  Musculoskeletal: Normal range of motion.  Neurological: She is alert and oriented to person, place, and time.  Skin: Skin is warm and dry. No rash noted.  No obvious macular, papular rash. No erythema noted but pt reports itching.   Psychiatric: She has a normal mood and affect. Her behavior is normal.  Nursing note and vitals reviewed.  ED Course  Procedures (including critical care time) DIAGNOSTIC STUDIES: Oxygen Saturation is 100% on RA, normal by my interpretation.    COORDINATION OF CARE: 3:43 PM-Discussed treatment plan which  includes Claritin, Prednisone, Pepcid and follow-up with PCP with pt at bedside and pt agreed to plan.   Labs Review Labs Reviewed - No data to display  Imaging Review No results found.   EKG Interpretation None      MDM   Final diagnoses:  Pruritic dermatitis   24 yo with report of itching, in hands and feet x 3 days, worse at night.  There are no objective finds, no redness, or swelling, or rash, no indication of scabies and no report of anyone else at home with similar symptoms.  Will treat with anti-histamine, H2 blocker and steroid safe for lactation.  Resources  provided to establish care with PCP and discussed return if symptoms worsen.  Pt verbalizes understanding with assistance of interpreter.  I personally performed the services described in this documentation, which was scribed in my presence. The recorded information has been reviewed and is accurate.  Filed Vitals:   01/23/14 1500  BP: 115/85  Pulse: 91  Temp: 97.8 F (36.6 C)  TempSrc: Oral  Resp: 20  SpO2: 100%   Meds given in ED:  Medications  loratadine (CLARITIN) tablet 10 mg (10 mg Oral Given 01/23/14 1615)  predniSONE (DELTASONE) tablet 60 mg (60 mg Oral Given 01/23/14 1557)  famotidine (PEPCID) tablet 20 mg (20 mg Oral Given 01/23/14 1557)    Discharge Medication List as of 01/23/2014  3:58 PM    START taking these medications   Details  diphenhydrAMINE (BENADRYL) 25 mg capsule Take 1 capsule (25 mg total) by mouth every 6 (six) hours as needed., Starting 01/23/2014, Until Discontinued, Print    !! famotidine (PEPCID) 20 MG tablet Take 1 tablet (20 mg total) by mouth 2 (two) times daily., Starting 01/23/2014, Until Discontinued, Print    predniSONE (DELTASONE) 20 MG tablet Take 2 tablets (40 mg total) by mouth daily., Starting 01/23/2014, Until Discontinued, Print     !! - Potential duplicate medications found. Please discuss with provider.       Harle Battiest, NP 01/24/14 1137  Toy Baker, MD 01/26/14 [phone]

## 2014-01-23 NOTE — Discharge Instructions (Signed)
Please follow the directions provided. Be sure to establish care at the [facility] to ensure you're getting better. If your itching does not improve after 5 days may return to the emergency department for further evaluation. Please take the Benadryl every 6 hours for itching. Please take the prednisone daily for 5 days. Please take the Pepcid twice a day for 5 days also. Don't hesitate to return for any new, worsening, or concerning symptoms.   SEEK MEDICAL CARE IF:  The itching does not go away after several days.   Emergency Department Resource Guide 1) Find a Doctor and Pay Out of Pocket Although you won't have to find out who is covered by your insurance plan, it is a good idea to ask around and get recommendations. You will then need to call the office and see if the doctor you have chosen will accept you as a new patient and what types of options they offer for patients who are self-pay. Some doctors offer discounts or will set up payment plans for their patients who do not have insurance, but you will need to ask so you aren't surprised when you get to your appointment.  2) Contact Your Local Health Department Not all health departments have doctors that can see patients for sick visits, but many do, so it is worth a call to see if yours does. If you don't know where your local health department is, you can check in your phone book. The CDC also has a tool to help you locate your state's health department, and many state websites also have listings of all of their local health departments.  3) Find a Walk-in Clinic If your illness is not likely to be very severe or complicated, you may want to try a walk in clinic. These are popping up all over the country in pharmacies, drugstores, and shopping centers. They're usually staffed by nurse practitioners or physician assistants that have been trained to treat common illnesses and complaints. They're usually fairly quick and  inexpensive. However, if you have serious medical issues or chronic medical problems, these are probably not your best option.  No Primary Care Doctor: - Call Health Connect at  [phone] - they can help you locate a primary care doctor that  accepts your insurance, provides certain services, etc. - Physician Referral Service- [phone]  Chronic Pain Problems: Organization         Address  Phone   Notes  [facility]  [phone] Patients need to be referred by their primary care doctor.   Medication Assistance: Organization         Address  Phone   Notes  [facility] [address] [place], [place] [address] [phone] --Must be a resident of [facility] -- Must have NO insurance coverage whatsoever (no Medicaid/ Medicare, etc.) -- The pt. MUST have a primary care doctor that directs their care regularly and follows them in the community   MedAssist  [phone]   [employer]  [phone]    Agencies that provide inexpensive medical care: Organization         Address  Phone   Notes  [facility]  [phone]   [facility]    [phone]   [facility] [address] [place], [place] [address] [phone]   [facility] [address], [place] [phone]  Planned Parenthood    [phone]   [facility]    [phone]   [facility] and [address], [place] Phone:  [phone], Fax:  [phone] Hours of Operation:  9 am - 6 pm, M-F.  Also accepts Medicaid/Medicare and self-pay.  [facility] for [place], [address], [place] Phone: [phone], Fax: [phone]. Hours of Operation:  8:30 am - 5:30 pm, M-F.  Also accepts Medicaid and self-pay.  [facility] [address] [place] Phone: [phone]   [place], [place], [place] [phone], Ext. [phone] Mondays & Thursdays: 7-9 AM.  First 15 patients are seen on a first come, first serve basis.    [place] Providers:  Organization         Address  Phone   Notes  [facility] [address], [place] [phone] Also accepts self-pay patients.  [facility] [address], [place]  [phone]   [place], [address], [place] [phone]   [facility] [address] [place] [phone]   Lucianne Lei [address], [place]   [phone] Only accepts [place] Access [place] patients after they have their name applied to their card.   Self-Pay (no insurance) in [facility]:  Organization         Address  Phone   Notes  Sickle Cell Patients, [facility] [place] [phone]   [facility] [place] [phone]   [facility] [place]  [place], [place], [place] [phone]   [facility]/Dr. Osei-Bonsu  [address] [place] or [address], [place] [phone] Phone number for both [place] and [place] locations is the same.  [facility] [address] [place] [phone]   [facility] [address] [place] or [address] [phone]   [facility] [address], [place] [phone], phone; [phone], fax Sees patients 1st and 3rd Saturday of every month.  Must not qualify for public or private insurance (i.e. Medicaid, Medicare, [place] Health Choice, Veterans' Benefits)  Household income should be no more than 200% of the poverty level The clinic cannot treat you if you are pregnant or  think you are pregnant  Sexually transmitted diseases are not treated at the clinic.    Dental Care: Organization         Address  Phone  Notes  [facility] [phone] Accepts children up to age 21 who are enrolled in [place] or [place]; pregnant women with a Medicaid card; and children who have applied for Medicaid or [place] Health Choice, but were declined, whose parents can pay a reduced fee at time of service.  [facility]  [address], [place] [phone] Accepts children up to age 38 who are enrolled in [place] or [place]; pregnant women with a Medicaid card; and children who have applied for Medicaid or [place], but were declined,  whose parents can pay a reduced fee at time of service.  [place] Adult [facility] Access PROGRAM  [place] [phone] Patients are seen by appointment only. Walk-ins are not accepted. [place] will see patients 41 years of age and older. Monday - Tuesday (8am-5pm) Most Wednesdays (8:30-5pm) $30 per visit, cash only  [facility] Access PROGRAM  [address], [facility] [phone] Patients are seen by appointment only. Walk-ins are not accepted. [place] will see patients 36 years of age and older. One Wednesday Evening (Monthly: Volunteer Based).  $30 per visit, cash only  [place]  [phone] for adults; Children under age 67, call Graduate Pediatric Dentistry at [phone]. Children aged 19-14, please call [phone] to request a pediatric application.  Dental services are provided in all areas of dental care including fillings, crowns and bridges, complete and partial dentures, implants, gum treatment, root canals, and extractions. Preventive care is also provided. Treatment is provided to both adults  and children. Patients are selected via a lottery and there is often a waiting list.   [facility] [address], [place]  [phone] [email or web address]   [facility] [address] [place], [place] [phone], Ext. [phone] Second and Fourth Thursday of each month, opens at 6:30 AM; Clinic ends at 9 AM.  Patients are seen on a first-come first-served basis, and a limited number are seen during each clinic.   [facility]  [address] Hillard Danker [place], [place] [phone]   Eligibility Requirements You must have lived in [place], [place], or [place] counties for at least the last three months.   You cannot be eligible for state or federal sponsored [employer], including [employer], [place], or [employer].   You generally cannot be eligible for healthcare insurance through your employer.    How to apply: Eligibility screenings are held every Tuesday and Wednesday afternoon from 1:00 pm until 4:00 pm. You do not need an appointment for the interview!  [facility] [address], [place], [place]   [place]  [place] Department  [place]  [phone]    Behavioral Health Resources in the Community: Intensive Outpatient Programs Organization         Address  Phone  Notes  [place] [address], [place], [place] [phone]   [facility] Outpatient [address], [place], [place]   ADS: Alcohol & Drug Svcs [address], [place], [place]   [place] [address]  [place], [place] or [phone]   Substance Abuse Resources Organization         Address  Phone  Notes  Alcohol and Drug Services  [phone]   [place]  [phone]   [facility]     Chinita Pester  [phone]   Residential & Outpatient Substance Abuse Program  [phone]   Psychological Services Organization         Address  Phone  Notes  [facility] [place]  [place]  [phone]   [place] [address] [place] or [phone]    Mobile Crisis Teams Organization         Address  Phone  Notes  [facility],  Mobile Crisis Care Unit  [phone]   [facility]otherapeutic [facility]  [address] [place], [place]   [facility] [address] [place] [phone]    Self-Help/Support Groups Organization         Address  Phone             Notes  [place] of [place] - variety of support groups  [place] Call for more information  Narcotics Anonymous (NA), [facility]aring [facility] [address], [employer] [place]  2 meetings at this location   Special educational needs teacher         Address  Phone  Notes  [facility] Residential Treatment [place],    [place]  [phone]   [facility]  [address] [place] [phone], [place], [place]   [place], [place] [phone] Admissions: 8am-3pm M-F  Incentives Substance [place] [address]    [place], [place] [phone]   [facility] [address] [place], [place], [place]   [facility] [address],  [place], [place]   Insight Programs - Intensive Outpatient [address] Kristeen [address], [place], [place]   [facility].,  [place], [place] [phone] or [phone]   [facility] Services (RTS) [address] [place], [place] Accepts Medicaid  [facility] [place] [address]  [place] [phone] Substance Abuse/Addiction Treatment   [facility] Organization         Address  Phone  Notes  [facility]  [phone]   Domenic Schwab, PhD [address] [place], [place]   [phone] or [phone]   [place], [place] [phone]   [facility] [address] [place], [place] [phone] Insurance/Medicaid/sponsorship through [facility] Families [address] [place]                                    [place], [place] [phone] [place] [address], [place] [phone]    Dr. Adele Schilder  [phone]   [facility] Dept. 1) [address], [place] 2) [place] 3)  [address], [place] [phone]  [phone]   [place] [phone] or [phone] (After Hours)

## 2014-01-23 NOTE — ED Notes (Signed)
Pt in c/o generalized itching since last night, denies rash, states the itching kept her up all night, denies pain

## 2014-04-09 ENCOUNTER — Ambulatory Visit (INDEPENDENT_AMBULATORY_CARE_PROVIDER_SITE_OTHER): Payer: No Typology Code available for payment source | Admitting: Family Medicine

## 2014-04-09 ENCOUNTER — Encounter: Payer: Self-pay | Admitting: Family Medicine

## 2014-04-09 VITALS — BP 114/61 | HR 88 | Resp 20 | Wt 94.7 lb

## 2014-04-09 DIAGNOSIS — Z975 Presence of (intrauterine) contraceptive device: Secondary | ICD-10-CM | POA: Diagnosis not present

## 2014-04-09 DIAGNOSIS — N921 Excessive and frequent menstruation with irregular cycle: Secondary | ICD-10-CM | POA: Diagnosis not present

## 2014-04-09 HISTORY — DX: Excessive and frequent menstruation with irregular cycle: N92.1

## 2014-04-09 HISTORY — DX: Presence of (intrauterine) contraceptive device: Z97.5

## 2014-04-09 MED ORDER — DOXYCYCLINE HYCLATE 100 MG PO CAPS
100.0000 mg | ORAL_CAPSULE | Freq: Two times a day (BID) | ORAL | Status: DC
Start: 2014-04-09 — End: 2014-05-15

## 2014-04-09 NOTE — Progress Notes (Signed)
Pt states she ahs been having vaginal bleeding almost every day x3 months.

## 2014-04-09 NOTE — Patient Instructions (Signed)
Etonogestrel      ETONOGESTREL (   JES TREL)     ( ) .    .      3 .      .           .    (S):                          :   -abnormal -blood      -cancer      - -  -  -  -heart     -     -  -  -  -seizures  -tobacco       - etonogestrel             -pregnant      -                         .          .       .  :                    .  :                    . :      .      .       .               : -amprenavir -bosentan -fosamprenavir        :  -barbiturate       -certain      -griseofulvin -medicines     felbamate    -modafinil -phenylbutazone -rifampin  -            tipranavir  -ST.          .                  .             .      .                   ()         .                 .          .                            :   -allergic               -breast -changes   -confusion       -dark -    -general         -loss         -severe  -severe      -shortness         -signs    -sudden          -trouble        -unusual   -unusually   -yellowing            (            .): -   -breast -changes   - -fever   -    -irregular -itching    -pain     -          .         Marland Kitchen  1-800-FDA-1088.                  . Marland Kitchen:    .      .                 .  2015  /  . (2011/06/27 15:37:45)  Etonogestrel implant What is this medicine? ETONOGESTREL (et oh noe JES trel) is a contraceptive (birth control) device. It is used to prevent pregnancy. It can be used for up to 3 years. This medicine may be used for other purposes; ask your health care provider or pharmacist if you have questions. COMMON BRAND NAME(S): Implanon, Nexplanon What should I tell my health care provider before I take this medicine? They need to know if you have any of these conditions: -abnormal vaginal bleeding -blood vessel disease or blood clots -cancer of the breast, cervix, or liver -depression -diabetes -gallbladder disease -headaches -heart disease or recent heart attack -high blood pressure -high cholesterol -kidney disease -liver disease -renal disease -seizures -tobacco smoker -an unusual or allergic reaction to etonogestrel, other hormones, anesthetics or antiseptics, medicines, foods, dyes, or preservatives -pregnant or trying to get pregnant -breast-feeding How should I use this medicine? This device is inserted just under the skin on the inner side of your upper arm by a health care professional. Talk to your pediatrician regarding the use of this medicine in children. Special care may be needed. Overdosage: If you think you've taken too much of this medicine contact a poison control center or emergency room at once. Overdosage: If you think you have taken too much of this medicine contact a poison control center or emergency room at once. NOTE: This medicine is only for you. Do not share this medicine with others. What if I miss a dose? This does not apply. What may interact with this medicine? Do  not take this medicine with any of the following medications: -amprenavir -bosentan -fosamprenavir This medicine may also interact with the following medications: -barbiturate medicines for inducing sleep or treating seizures -certain medicines for fungal infections like ketoconazole and itraconazole -griseofulvin -medicines to treat seizures like carbamazepine, felbamate, oxcarbazepine, phenytoin, topiramate -modafinil -phenylbutazone -rifampin -some medicines to treat HIV infection like atazanavir, indinavir, lopinavir, nelfinavir, tipranavir, ritonavir -St. John's wort This list may not describe all possible interactions. Give your health care provider a list of all the medicines, herbs, non-prescription drugs, or dietary supplements you use. Also tell them if you smoke, drink alcohol, or use illegal drugs. Some items may interact with your medicine. What should I watch for while using this medicine? This product does not protect you against HIV infection (AIDS) or other sexually transmitted diseases. You should be able to feel the implant by pressing your fingertips over the skin where it was inserted. Tell your doctor if you cannot feel the implant. What side effects may I notice from receiving this medicine? Side effects that you should report to your doctor or health care professional as soon as possible: -allergic reactions like skin rash, itching or hives, swelling of the face, lips, or tongue -breast lumps -changes in vision -confusion, trouble speaking or understanding -dark urine -depressed mood -general ill feeling or flu-like symptoms -light-colored stools -loss of appetite, nausea -right upper belly pain -severe headaches -severe pain, swelling, or tenderness in the abdomen -shortness of breath, chest pain, swelling in a leg -signs of pregnancy -sudden numbness or weakness of the face, arm or leg -trouble walking, dizziness, loss of balance or coordination -  unusual  vaginal bleeding, discharge -unusually weak or tired -yellowing of the eyes or skin Side effects that usually do not require medical attention (Report these to your doctor or health care professional if they continue or are bothersome.): -acne -breast pain -changes in weight -cough -fever or chills -headache -irregular menstrual bleeding -itching, burning, and vaginal discharge -pain or difficulty passing urine -sore throat This list may not describe all possible side effects. Call your doctor for medical advice about side effects. You may report side effects to FDA at 1-800-FDA-1088. Where should I keep my medicine? This drug is given in a hospital or clinic and will not be stored at home. NOTE: This sheet is a summary. It may not cover all possible information. If you have questions about this medicine, talk to your doctor, pharmacist, or health care provider.  2015, Elsevier/Gold Standard. (2011-06-27 15:37:45)

## 2014-04-09 NOTE — Progress Notes (Signed)
    Subjective:    Patient ID: Brooke HedgerKhadiga Perry is a 24 y.o. female presenting with abnormal bleeding  on 04/09/2014  HPI: Patient with Nexplanon x 1 year and 3 months.  Now with bleeding x 3 months. Prior to this not having cycles. Reports heavy bleeding now. 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  Review of Systems  Constitutional: Negative for fever and chills.  Respiratory: Negative for shortness of breath.   Cardiovascular: Negative for chest pain.  Gastrointestinal: Negative for nausea, vomiting and abdominal pain.  Genitourinary: Negative for dysuria.  Skin: Negative for rash.      Objective:    BP 114/61 mmHg  Pulse 88  Resp 20  Wt 94 lb 11.2 oz (42.956 kg)  LMP  (LMP Unknown)  Breastfeeding? Yes Physical Exam  Constitutional: She is oriented to person, place, and time. She appears well-developed and well-nourished. No distress.  HENT:  Head: Normocephalic and atraumatic.  Eyes: No scleral icterus.  Neck: Neck supple.  Cardiovascular: Normal rate.   Pulmonary/Chest: Effort normal.  Abdominal: Soft.  Neurological: She is alert and oriented to person, place, and time.  Skin: Skin is warm and dry.  Psychiatric: She has a normal mood and affect.        Assessment & Plan:   Problem List Items Addressed This Visit      Unprioritized   Breakthrough bleeding on Nexplanon - Primary    Trial of Doxycycline.      Relevant Medications   doxycycline (VIBRAMYCIN) capsule      Return if symptoms worsen or fail to improve.  PRATT,TANYA S 04/09/2014 3:07 PM

## 2014-04-09 NOTE — Assessment & Plan Note (Signed)
Trial of Doxycycline.

## 2014-05-15 ENCOUNTER — Ambulatory Visit (INDEPENDENT_AMBULATORY_CARE_PROVIDER_SITE_OTHER): Payer: No Typology Code available for payment source | Admitting: Obstetrics & Gynecology

## 2014-05-15 ENCOUNTER — Encounter: Payer: Self-pay | Admitting: Obstetrics & Gynecology

## 2014-05-15 VITALS — BP 130/70 | HR 74 | Temp 98.3°F | Wt 94.5 lb

## 2014-05-15 DIAGNOSIS — Z3042 Encounter for surveillance of injectable contraceptive: Secondary | ICD-10-CM

## 2014-05-15 DIAGNOSIS — N939 Abnormal uterine and vaginal bleeding, unspecified: Secondary | ICD-10-CM | POA: Diagnosis not present

## 2014-05-15 DIAGNOSIS — Z3049 Encounter for surveillance of other contraceptives: Secondary | ICD-10-CM

## 2014-05-15 MED ORDER — MEDROXYPROGESTERONE ACETATE 104 MG/0.65ML ~~LOC~~ SUSP
104.0000 mg | Freq: Once | SUBCUTANEOUS | Status: AC
  Administered 2014-05-15: 104 mg via SUBCUTANEOUS

## 2014-05-15 NOTE — Progress Notes (Signed)
Patient ID: Brooke HedgerKhadiga Shore, female   DOB: 01/28/1990, 24 y.o.   MRN: [id number] Pt c/o continued bleeding since having Nexplanon. She reports that she has tried taking pills to stop the bleeding but, doesn't want to keep the Nexplanon but, wants Depo provera.  Patient given informed consent, she signed consent form. She has gained >40# since her Nexplanon was placed.  She wants it removed.  Appropriate time out taken.  Patient's left arm was prepped and draped in the usual sterile fashion. Patient was prepped with alcohol swab and then injected with 3 ml of 1 % lidocaine.  She was prepped with betadine.  A #15 blade was used to make a small incision over the Nexplanon rod.  Using curved forceps, the end of the rod was grasped and the scar tissue was removed and the device was removed intact.  There was minimal blood loss. The incision site was covered with guaze and a pressure bandage to reduce any bruising.  The patient tolerated the procedure well and was given post procedure instructions.  Pt received Depo Provera 104mg  prior to discharge.  Dez Stauffer L. Harraway-Smith, M.D., Evern CoreFACOG

## 2014-05-15 NOTE — Patient Instructions (Signed)

## 2014-05-15 NOTE — Progress Notes (Signed)
Patient ID: Brooke HedgerKhadiga Perry, female   DOB: 01/28/1990, 24 y.o.   MRN: [id number] Interpreter present for encounter Pt desires Nexplanon removal

## 2014-06-17 ENCOUNTER — Encounter: Payer: Self-pay | Admitting: General Practice

## 2014-08-04 ENCOUNTER — Ambulatory Visit: Payer: Self-pay

## 2014-08-28 ENCOUNTER — Telehealth: Payer: Self-pay | Admitting: *Deleted

## 2014-08-28 ENCOUNTER — Ambulatory Visit: Payer: No Typology Code available for payment source

## 2014-08-28 NOTE — Telephone Encounter (Signed)
Pt has been on depo, she came to clinic for her injection today and stated that she is bleeding and no longer wants depo. I advised patient that she is most likely bleeding because her dose is wearing off. Pt states that she doesn't want the depo and is planning to get pregnant.

## 2014-10-27 ENCOUNTER — Encounter (HOSPITAL_COMMUNITY): Payer: Self-pay | Admitting: *Deleted

## 2014-10-27 ENCOUNTER — Inpatient Hospital Stay (HOSPITAL_COMMUNITY)
Admission: AD | Admit: 2014-10-27 | Discharge: 2014-10-27 | Disposition: A | Discharge status: Home / Self Care | Payer: No Typology Code available for payment source | Source: Ambulatory Visit | Attending: Family Medicine | Admitting: Family Medicine

## 2014-10-27 DIAGNOSIS — Z113 Encounter for screening for infections with a predominantly sexual mode of transmission: Secondary | ICD-10-CM | POA: Insufficient documentation

## 2014-10-27 DIAGNOSIS — R192 Visible peristalsis: Secondary | ICD-10-CM

## 2014-10-27 DIAGNOSIS — R1912 Hyperactive bowel sounds: Secondary | ICD-10-CM | POA: Insufficient documentation

## 2014-10-27 LAB — URINALYSIS, ROUTINE W REFLEX MICROSCOPIC
Bilirubin Urine: NEGATIVE
GLUCOSE, UA: NEGATIVE mg/dL
KETONES UR: NEGATIVE mg/dL
Leukocytes, UA: NEGATIVE
Nitrite: NEGATIVE
PROTEIN: NEGATIVE mg/dL
Specific Gravity, Urine: 1.01 (ref 1.005–1.030)
UROBILINOGEN UA: 0.2 mg/dL (ref 0.0–1.0)
pH: 6.5 (ref 5.0–8.0)

## 2014-10-27 LAB — WET PREP, GENITAL
Clue Cells Wet Prep HPF POC: NONE SEEN
Trich, Wet Prep: NONE SEEN
Yeast Wet Prep HPF POC: NONE SEEN

## 2014-10-27 LAB — POCT PREGNANCY, URINE: PREG TEST UR: NEGATIVE

## 2014-10-27 LAB — URINE MICROSCOPIC-ADD ON

## 2014-10-27 NOTE — MAU Note (Signed)
Pt had nexplanon removed prior to august 2016, she then received the depo shot sometime after that which she cannot remember.  She has been bleeding off and on for the past three months and has not intention to continue depo as she is trying to become pregnant.  Pt reports tightening and lower abd pain for the past three days.  Had sex one month ago; denies vaginal discharge or odor.

## 2014-10-27 NOTE — MAU Provider Note (Signed)
History     CSN: [id number]  Arrival date and time: 10/27/14 1450   First Provider Initiated Contact with Patient 10/27/14 1645      No chief complaint on file.  HPI   Brooke Perry is a 24 y.o. female G1P1001 presenting with questionable movement in her abdomen. She would like an US to see what is moving inside her abdomen. The movement started 3 days ago. The patient denies any other complaints at this time, she just wants to know what is moving inside her abdomen.   She has been eating and drinking normally.   Patient would like to have STI testing.   OB History    Gravida Para Term Preterm AB TAB SAB Ectopic Multiple Living   1 1 1       1       Past Medical History  Diagnosis Date  . GERD (gastroesophageal reflux disease)   . Abnormal vaginal bleeding     Past Surgical History  Procedure Laterality Date  . No past surgeries      History reviewed. No pertinent family history.  Social History  Substance Use Topics  . Smoking status: Never Smoker   . Smokeless tobacco: Never Used  . Alcohol Use: No    Allergies: No Known Allergies  Prescriptions prior to admission  Medication Sig Dispense Refill Last Dose  . acetaminophen (TYLENOL) 325 MG tablet Take 2 tablets (650 mg total) by mouth every 6 (six) hours as needed for pain. (Patient not taking: Reported on 10/27/2014) 60 tablet 0 Taking   Results for orders placed or performed during the hospital encounter of 10/27/14 (from the past 48 hour(s))  Urinalysis, Routine w reflex microscopic (not at [facility])     Status: Abnormal   Collection Time: 10/27/14  3:43 PM  Result Value Ref Range   Color, Urine YELLOW YELLOW   APPearance CLEAR CLEAR   Specific Gravity, Urine 1.010 1.005 - 1.030   pH 6.5 5.0 - 8.0   Glucose, UA NEGATIVE NEGATIVE mg/dL   Hgb urine dipstick LARGE (A) NEGATIVE   Bilirubin Urine NEGATIVE NEGATIVE   Ketones, ur NEGATIVE NEGATIVE mg/dL   Protein, ur NEGATIVE NEGATIVE mg/dL   Urobilinogen,  UA 0.2 0.0 - 1.0 mg/dL   Nitrite NEGATIVE NEGATIVE   Leukocytes, UA NEGATIVE NEGATIVE  Urine microscopic-add on     Status: None   Collection Time: 10/27/14  3:43 PM  Result Value Ref Range   Squamous Epithelial / LPF RARE RARE   RBC / HPF 0-2 <3 RBC/hpf   Bacteria, UA RARE RARE  Pregnancy, urine POC     Status: None   Collection Time: 10/27/14  4:03 PM  Result Value Ref Range   Preg Test, Ur NEGATIVE NEGATIVE    Comment:        THE SENSITIVITY OF THIS METHODOLOGY IS >24 mIU/mL   Wet prep, genital     Status: Abnormal   Collection Time: 10/27/14  5:00 PM  Result Value Ref Range   Yeast Wet Prep HPF POC NONE SEEN NONE SEEN   Trich, Wet Prep NONE SEEN NONE SEEN   Clue Cells Wet Prep HPF POC NONE SEEN NONE SEEN   WBC, Wet Prep HPF POC FEW (A) NONE SEEN    Comment: BACTERIA- TOO NUMEROUS TO COUNT    Review of Systems  Constitutional: Negative for fever and weight loss.  Gastrointestinal: Negative for nausea, vomiting, abdominal pain, diarrhea, constipation, blood in stool and melena.  Genitourinary: Negative for dysuria,  urgency and frequency.  Musculoskeletal: Negative for back pain.   Physical Exam   Blood pressure 105/60, pulse 81, temperature 97.8 F (36.6 C), temperature source Oral, resp. rate 16, last menstrual period 10/25/2014, currently breastfeeding.  Physical Exam  Constitutional: She is oriented to person, place, and time. She appears well-developed and well-nourished. No distress.  HENT:  Head: Normocephalic.  Respiratory: Effort normal.  GI: Soft. Normal appearance. She exhibits pulsatile midline mass. She exhibits no shifting dullness, no distension, no pulsatile liver, no fluid wave, no abdominal bruit, no ascites and no mass. Bowel sounds are increased. There is no tenderness.  Genitourinary:  Wet prep and GC collected without a speculum by RN   Musculoskeletal: Normal range of motion.  Neurological: She is alert and oriented to person, place, and time.   Skin: Skin is warm. She is not diaphoretic.  Psychiatric: Her behavior is normal.    MAU Course  Procedures  None  MDM   Assessment and Plan   A:  1. Increased peristalsis    P:  Discharge home in stable condition Return to MAU for emergencies only  Follow up with PCP   Duane Lope, NP 10/27/2014 7:55 PM

## 2014-10-28 LAB — GC/CHLAMYDIA PROBE AMP (~~LOC~~) NOT AT ARMC
CHLAMYDIA, DNA PROBE: NEGATIVE
Neisseria Gonorrhea: NEGATIVE

## 2015-03-03 ENCOUNTER — Encounter (HOSPITAL_COMMUNITY): Payer: Self-pay | Admitting: Emergency Medicine

## 2015-03-03 ENCOUNTER — Emergency Department (INDEPENDENT_AMBULATORY_CARE_PROVIDER_SITE_OTHER)
Admission: EM | Admit: 2015-03-03 | Discharge: 2015-03-03 | Disposition: A | Discharge status: Home / Self Care | Payer: No Typology Code available for payment source | Source: Home / Self Care | Attending: Emergency Medicine | Admitting: Emergency Medicine

## 2015-03-03 DIAGNOSIS — K2970 Gastritis, unspecified, without bleeding: Secondary | ICD-10-CM

## 2015-03-03 LAB — POCT URINALYSIS DIP (DEVICE)
BILIRUBIN URINE: NEGATIVE
Glucose, UA: NEGATIVE mg/dL
Ketones, ur: NEGATIVE mg/dL
Leukocytes, UA: NEGATIVE
NITRITE: NEGATIVE
PH: 5.5 (ref 5.0–8.0)
Protein, ur: 30 mg/dL — AB
Specific Gravity, Urine: 1.025 (ref 1.005–1.030)
Urobilinogen, UA: 0.2 mg/dL (ref 0.0–1.0)

## 2015-03-03 LAB — POCT PREGNANCY, URINE: Preg Test, Ur: NEGATIVE

## 2015-03-03 MED ORDER — RANITIDINE HCL 150 MG PO CAPS: 150.0000 mg | ORAL_CAPSULE | Freq: Two times a day (BID) | ORAL | Status: DC

## 2015-03-03 MED ORDER — OMEPRAZOLE 40 MG PO CPDR: 40.0000 mg | DELAYED_RELEASE_CAPSULE | Freq: Every day | ORAL | Status: DC

## 2015-03-03 MED ORDER — ONDANSETRON HCL 4 MG PO TABS: 4.0000 mg | ORAL_TABLET | Freq: Three times a day (TID) | ORAL | Status: DC | PRN

## 2015-03-03 NOTE — ED Provider Notes (Signed)
CSN: [id number]     Arrival date & time 03/03/15  1419 History   First MD Initiated Contact with Patient 03/03/15 1558     Chief Complaint  Patient presents with  . Abdominal Pain   (Consider location/radiation/quality/duration/timing/severity/associated sxs/prior Treatment) HPI She is a 25 year old woman here for evaluation of abdominal pain. A friend acts as interpreter when necessary. She states she developed pain in her upper abdomen this morning around 9:30. She describes it as an intermittent twisting sort of pain. It will radiate to either side. It comes and goes. She has not had anything to eat today due to the pain. She reports some nausea with the pain, but denies any vomiting. She has had some nonbloody diarrhea. No urinary or vaginal complaints. No fevers or chills. She has not tried any medications. She denies any prior similar pain.  Past Medical History  Diagnosis Date  . GERD (gastroesophageal reflux disease)   . Abnormal vaginal bleeding    Past Surgical History  Procedure Laterality Date  . No past surgeries     No family history on file. Social History  Substance Use Topics  . Smoking status: Never Smoker   . Smokeless tobacco: Never Used  . Alcohol Use: No   OB History    Gravida Para Term Preterm AB TAB SAB Ectopic Multiple Living   Review of Systems As in history of present illness Allergies  Review of patient's allergies indicates no known allergies.  Home Medications   Prior to Admission medications   Medication Sig Start Date End Date Taking? Authorizing Provider  omeprazole (PRILOSEC) 40 MG capsule Take 1 capsule (40 mg total) by mouth daily. 03/03/15   Charm Rings, MD  ondansetron (ZOFRAN) 4 MG tablet Take 1 tablet (4 mg total) by mouth every 8 (eight) hours as needed for nausea or vomiting. 03/03/15   Charm Rings, MD  ranitidine (ZANTAC) 150 MG capsule Take 1 capsule (150 mg total) by mouth 2 (two) times daily. 03/03/15   Charm Rings, MD   Meds Ordered and Administered this Visit  Medications - No data to display  BP 106/66 mmHg  Pulse 110  Temp(Src) 97.9 F (36.6 C) (Oral)  Resp 16  SpO2 100%  LMP 03/03/2015 No data found.   Physical Exam  Constitutional: She is oriented to person, place, and time. She appears well-developed and well-nourished. No distress.  Neck: Neck supple.  Cardiovascular: Normal rate, regular rhythm and normal heart sounds.   No murmur heard. Pulmonary/Chest: Effort normal and breath sounds normal. No respiratory distress. She has no wheezes. She has no rales.  Abdominal: Soft. Bowel sounds are normal. She exhibits no distension and no mass. There is tenderness (primarily epigastric. She does have some mild tenderness in the suprapubic area.). There is no rebound and no guarding.  Neurological: She is alert and oriented to person, place, and time.    ED Course  Procedures (including critical care time)  Labs Review Labs Reviewed  POCT URINALYSIS DIP (DEVICE) - Abnormal; Notable for the following:    Hgb urine dipstick MODERATE (*)    Protein, ur 30 (*)    All other components within normal limits  POCT PREGNANCY, URINE    Imaging Review No results found.    MDM   1. Gastritis    History exam consistent with gastritis. Blood in urine is likely secondary to menses. We'll treat  with omeprazole and ranitidine for 2 weeks. Zofran as needed for nausea. Return precautions reviewed.    Charm Rings, MD 03/03/15 [phone]

## 2015-03-03 NOTE — ED Notes (Signed)
Upper abdominal pain and diarrhea, no vomiting.  Onset today of symptoms.  Pain radiates from center epigastric to her sides.  [place] 4 [place]

## 2015-03-03 NOTE — Discharge Instructions (Signed)
You have something called gastritis. This is inflammation and irritation of the stomach. Take omeprazole daily for 2 weeks. Take ranitidine twice a day for 2 weeks. Use the Zofran every 8 hours as needed for nausea or vomiting. This should improve over the next week or 2. If the pain is getting worse, you see blood in your stool, or start vomiting blood, please go to the emergency room.

## 2015-07-06 ENCOUNTER — Encounter: Payer: Self-pay | Admitting: Obstetrics and Gynecology

## 2015-07-06 ENCOUNTER — Ambulatory Visit (INDEPENDENT_AMBULATORY_CARE_PROVIDER_SITE_OTHER): Payer: Medicaid Other | Admitting: Obstetrics and Gynecology

## 2015-07-06 VITALS — BP 124/69 | HR 72 | Wt 94.9 lb

## 2015-07-06 DIAGNOSIS — N760 Acute vaginitis: Secondary | ICD-10-CM | POA: Diagnosis present

## 2015-07-06 LAB — POCT URINALYSIS DIP (DEVICE)
BILIRUBIN URINE: NEGATIVE
Glucose, UA: NEGATIVE mg/dL
Hgb urine dipstick: NEGATIVE
KETONES UR: NEGATIVE mg/dL
Leukocytes, UA: NEGATIVE
Nitrite: NEGATIVE
PH: 6 (ref 5.0–8.0)
Protein, ur: NEGATIVE mg/dL
Specific Gravity, Urine: <=1.005 (ref 1.005–1.030)
Urobilinogen, UA: 0.2 mg/dL (ref 0.0–1.0)

## 2015-07-06 NOTE — Progress Notes (Signed)
25 yo G1P1 presenting today for the evaluation of vaginal pain. Patient reports onset of pain 6 months ago. She describes it as a stabbing pain in the vagina. The pain is sometimes associated with intercourse. She denies the presence of a discharge or foul odor. She reports pain with urination as well. Patient desires to conceive and reports monthly 5-6 day cycles. Her husband travels for work and is often gone for 2 weeks out of the month. She stopped using depo-provera over a year ago  Past Medical History  Diagnosis Date  . GERD (gastroesophageal reflux disease)   . Abnormal vaginal bleeding    Past Surgical History  Procedure Laterality Date  . No past surgeries     History reviewed. No pertinent family history. Social History  Substance Use Topics  . Smoking status: Never Smoker   . Smokeless tobacco: Never Used  . Alcohol Use: No   ROS See pertinent in HPI Blood pressure 124/69, pulse 72, weight 94 lb 14.4 oz (43.046 kg), currently breastfeeding. GENERAL: Well-developed, well-nourished female in no acute distress.  ABDOMEN: Soft, nontender, nondistended. No organomegaly. PELVIC: Normal external female genitalia. Vagina is pink and rugated.  Normal discharge. Normal appearing cervix. Uterus is normal in size. No adnexal mass or tenderness. EXTREMITIES: No cyanosis, clubbing, or edema, 2+ distal pulses.  A/P 25 yo with vaginitis - wet prep collected - urine culture also collected - Explained how to determine ovulation and timing of intercourse to optimize conception. Also explained to the patient that her husband's schedule may be the rate limiting step - Patient will be contacted with any abnormal results - Advised to start taking prenatal vitamins now - RTC prn

## 2015-07-06 NOTE — Progress Notes (Signed)
Video Interpreter [phone]

## 2015-07-07 LAB — URINE CULTURE

## 2015-07-07 LAB — WET PREP, GENITAL
Trich, Wet Prep: NONE SEEN
Yeast Wet Prep HPF POC: NONE SEEN

## 2015-07-08 ENCOUNTER — Telehealth: Payer: Self-pay | Admitting: General Practice

## 2015-07-08 MED ORDER — METRONIDAZOLE 500 MG PO TABS: 500.0000 mg | ORAL_TABLET | Freq: Two times a day (BID) | ORAL | Status: DC

## 2015-07-08 NOTE — Telephone Encounter (Signed)
Patient has BV and flagyl has been sent to pharmacy. Called patient with pacific interpreter [phone] & informed her of results, what BV is & medication sent to pharmacy. Patient verbalized understanding & had no questions

## 2015-07-08 NOTE — Addendum Note (Signed)
Addended by: Catalina AntiguaONSTANT, Maximillian Habibi on: 07/08/2015 08:26 AM   Modules accepted: Orders

## 2015-08-03 ENCOUNTER — Ambulatory Visit (INDEPENDENT_AMBULATORY_CARE_PROVIDER_SITE_OTHER): Payer: Medicaid Other | Admitting: *Deleted

## 2015-08-03 ENCOUNTER — Encounter: Payer: Self-pay | Admitting: *Deleted

## 2015-08-03 DIAGNOSIS — Z3201 Encounter for pregnancy test, result positive: Secondary | ICD-10-CM | POA: Diagnosis present

## 2015-08-03 DIAGNOSIS — Z3687 Encounter for antenatal screening for uncertain dates: Secondary | ICD-10-CM

## 2015-08-03 LAB — POCT PREGNANCY, URINE: Preg Test, Ur: POSITIVE — AB

## 2015-08-03 NOTE — Progress Notes (Signed)
Here for pregnancy test which was positive. States periods not regualar but LMP 06/23/15 which makes her [redacted]w[redacted]d  With [facility] 03/29/16,  Will order [place] . Used Video interpreter Saif [id number]. Would like to get prenatal care here.

## 2015-08-06 ENCOUNTER — Ambulatory Visit (INDEPENDENT_AMBULATORY_CARE_PROVIDER_SITE_OTHER): Payer: Medicaid Other | Admitting: Obstetrics and Gynecology

## 2015-08-06 ENCOUNTER — Encounter: Payer: Self-pay | Admitting: Obstetrics and Gynecology

## 2015-08-06 ENCOUNTER — Ambulatory Visit (HOSPITAL_COMMUNITY)
Admission: RE | Admit: 2015-08-06 | Discharge: 2015-08-06 | Disposition: A | Discharge status: Home / Self Care | Payer: Self-pay | Source: Ambulatory Visit | Attending: Obstetrics and Gynecology | Admitting: Obstetrics and Gynecology

## 2015-08-06 DIAGNOSIS — Z36 Encounter for antenatal screening of mother: Secondary | ICD-10-CM | POA: Insufficient documentation

## 2015-08-06 DIAGNOSIS — Z331 Pregnant state, incidental: Secondary | ICD-10-CM | POA: Diagnosis not present

## 2015-08-06 DIAGNOSIS — Z603 Acculturation difficulty: Secondary | ICD-10-CM | POA: Diagnosis present

## 2015-08-06 DIAGNOSIS — Z3A01 Less than 8 weeks gestation of pregnancy: Secondary | ICD-10-CM | POA: Insufficient documentation

## 2015-08-06 DIAGNOSIS — Z3490 Encounter for supervision of normal pregnancy, unspecified, unspecified trimester: Secondary | ICD-10-CM

## 2015-08-06 DIAGNOSIS — Z3687 Encounter for antenatal screening for uncertain dates: Secondary | ICD-10-CM

## 2015-08-06 DIAGNOSIS — O208 Other hemorrhage in early pregnancy: Secondary | ICD-10-CM | POA: Insufficient documentation

## 2015-08-06 NOTE — Progress Notes (Signed)
Ultrasounds Results Note 08/06/2015 Clinic: [facility]  SUBJECTIVE HPI:  Ms. Brooke Perry is a 25 y.o. G2P1001 at Unknown by LMP who presents to the [facility] for followup ultrasound results. She denies any s/s of SAB. LMP irregular but ? June 20th. Pt not on anything for [facility]. Last took depo in 2016   Past Medical History:  Diagnosis Date  . Abnormal vaginal bleeding   . Breakthrough bleeding on Nexplanon 04/09/2014  . GERD (gastroesophageal reflux disease)    Past Surgical History:  Procedure Laterality Date  . NO PAST SURGERIES     Social History   Social History  . Marital status: Married    Spouse name: N/A  . Number of children: N/A  . Years of education: N/A   Occupational History  . Not on file.   Social History Main Topics  . Smoking status: Never Smoker  . Smokeless tobacco: Never Used  . Alcohol use No  . Drug use: No  . Sexual activity: Not Currently    Birth control/ protection: Injection     Comment: one month ago or longer   Other Topics Concern  . Not on file   Social History Narrative  . No narrative on file   Medications: PNV  No Known Allergies  I have reviewed patient's Past Medical Hx, Surgical Hx, Family Hx, Social Hx, medications and allergies.   Review of Systems As per HPI   Physical Exam  There were no vitals taken for this visit.  GENERAL: Well-developed, well-nourished female in no acute distress.   LAB RESULTS No results found for this or any previous visit (from the past 24 hour(s)).  IMAGING US Ob Comp Less 14 Wks  Result Date: 08/06/2015 CLINICAL DATA:  Early pregnancy.  Evaluate viability.  Dating. LMP was6 2017. Gestational age by LMP is6 weeks 2 days. EDC by LMP is03/27/2018. EXAM: OBSTETRIC <14 WK [place] AND TRANSVAGINAL OB US TECHNIQUE: Both transabdominal and transvaginal ultrasound examinations were performed for complete evaluation of the gestation as well as the maternal uterus, adnexal  regions, and pelvic cul-de-sac. Transvaginal technique was performed to assess early pregnancy. COMPARISON:  04/13/2012 FINDINGS: Intrauterine gestational sac: Present Yolk sac:  Present Embryo:  Present Cardiac Activity: Present Heart Rate: 106  bpm CRL:  2.0  mm   5 w   5 d                  [place] EDC: 04/02/2016 Subchorionic hemorrhage: Moderate subchorionic hemorrhage is present. Maternal uterus/adnexae: Ovaries are normal in appearance. Trace free pelvic fluid. IMPRESSION: 1. Single living intrauterine embryo. Moderate subchorionic hemorrhage. 2. Size and dates correlate well. Today's exam confirms clinical EDC of 03/29/2016. Electronically Signed   By: Norva Pavlov M.D.   On: 08/06/2015 15:18   US Ob Transvaginal  Result Date: 08/06/2015 CLINICAL DATA:  Early pregnancy.  Evaluate viability.  Dating. LMP was6 2017. Gestational age by LMP is6 weeks 2 days. EDC by LMP is03/27/2018. EXAM: OBSTETRIC <14 WK [place] AND TRANSVAGINAL OB US TECHNIQUE: Both transabdominal and transvaginal ultrasound examinations were performed for complete evaluation of the gestation as well as the maternal uterus, adnexal regions, and pelvic cul-de-sac. Transvaginal technique was performed to assess early pregnancy. COMPARISON:  04/13/2012 FINDINGS: Intrauterine gestational sac: Present Yolk sac:  Present Embryo:  Present Cardiac Activity: Present Heart Rate: 106  bpm CRL:  2.0  mm   5 w   5 d  [place] EDC: 04/02/2016 Subchorionic hemorrhage: Moderate subchorionic hemorrhage is present. Maternal uterus/adnexae: Ovaries are normal in appearance. Trace free pelvic fluid. IMPRESSION: 1. Single living intrauterine embryo. Moderate subchorionic hemorrhage. 2. Size and dates correlate well. Today's exam confirms clinical EDC of 03/29/2016. Electronically Signed   By: Norva Pavlov M.D.   On: 08/06/2015 15:18    ASSESSMENT 1. Language barrier, cultural differences   2. Early stage of pregnancy     PLAN Interpreter  used RTC in at least 11d for u/s follow up to confirm viability  [place] Bing, MD  08/06/2015  3:33 PM

## 2015-08-17 ENCOUNTER — Ambulatory Visit (HOSPITAL_COMMUNITY)
Admission: RE | Admit: 2015-08-17 | Discharge: 2015-08-17 | Disposition: A | Discharge status: Home / Self Care | Payer: Medicaid Other | Source: Ambulatory Visit | Attending: Obstetrics and Gynecology | Admitting: Obstetrics and Gynecology

## 2015-08-17 ENCOUNTER — Encounter: Payer: Self-pay | Admitting: Family Medicine

## 2015-08-17 ENCOUNTER — Ambulatory Visit (INDEPENDENT_AMBULATORY_CARE_PROVIDER_SITE_OTHER): Payer: Self-pay | Admitting: Family Medicine

## 2015-08-17 VITALS — BP 117/65 | HR 70 | Wt 92.6 lb

## 2015-08-17 DIAGNOSIS — Z603 Acculturation difficulty: Secondary | ICD-10-CM

## 2015-08-17 DIAGNOSIS — Z3A01 Less than 8 weeks gestation of pregnancy: Secondary | ICD-10-CM | POA: Insufficient documentation

## 2015-08-17 DIAGNOSIS — Z331 Pregnant state, incidental: Secondary | ICD-10-CM | POA: Diagnosis not present

## 2015-08-17 DIAGNOSIS — Z3490 Encounter for supervision of normal pregnancy, unspecified, unspecified trimester: Secondary | ICD-10-CM

## 2015-08-17 DIAGNOSIS — Z3481 Encounter for supervision of other normal pregnancy, first trimester: Secondary | ICD-10-CM

## 2015-08-17 NOTE — Progress Notes (Signed)
CLINIC ENCOUNTER NOTE  History:  25 y.o. G2P1001 here today for f/u US results for viability of pregnancy.  LMP: 06/23/15 EDC US: 03/31/16  Viability confirmed on US today, with FHR 144.   Taking prenatal vitamins.     Past Medical History:  Diagnosis Date  . Abnormal vaginal bleeding   . Breakthrough bleeding on Nexplanon 04/09/2014  . GERD (gastroesophageal reflux disease)     Past Surgical History:  Procedure Laterality Date  . NO PAST SURGERIES      The following portions of the patient's history were reviewed and updated as appropriate: allergies, current medications, past family history, past medical history, past social history, past surgical history and problem list.   Review of Systems:  See above; comprehensive review of systems was otherwise negative.  Objective:  Physical Exam BP 117/65   Pulse 70   Wt 92 lb 9.6 oz (42 kg)   BMI 16.94 kg/m  CONSTITUTIONAL: Well-developed, well-nourished female in no acute distress.  HENT:  Normocephalic, atraumatic SKIN: Skin is warm and dry.  NEUROLGIC: Alert  PSYCHIATRIC: Normal mood and affect.  CARDIOVASCULAR: Normal heart rate noted RESPIRATORY: Effort and breath sounds normal, no problems with respiration noted ABDOMEN: Soft, no distention noted.  No tenderness, rebound or guarding.     Labs and Imaging [place] Ob Comp Less 14 Wks  Result Date: 08/06/2015 CLINICAL DATA:  Early pregnancy.  Evaluate viability.  Dating. LMP was6 2017. Gestational age by LMP is6 weeks 2 days. EDC by LMP is03/27/2018. EXAM: OBSTETRIC <14 WK US AND TRANSVAGINAL OB US TECHNIQUE: Both transabdominal and transvaginal ultrasound examinations were performed for complete evaluation of the gestation as well as the maternal uterus, adnexal regions, and pelvic cul-de-sac. Transvaginal technique was performed to assess early pregnancy. COMPARISON:  04/13/2012 FINDINGS: Intrauterine gestational sac: Present Yolk sac:  Present Embryo:  Present Cardiac  Activity: Present Heart Rate: 106  bpm CRL:  2.0  mm   5 w   5 d                  US EDC: 04/02/2016 Subchorionic hemorrhage: Moderate subchorionic hemorrhage is present. Maternal uterus/adnexae: Ovaries are normal in appearance. Trace free pelvic fluid. IMPRESSION: 1. Single living intrauterine embryo. Moderate subchorionic hemorrhage. 2. Size and dates correlate well. Today's exam confirms clinical EDC of 03/29/2016. Electronically Signed   By: Norva PavlovElizabeth  Brown M.D.   On: 08/06/2015 15:18   [place] Ob Transvaginal  Result Date: 08/17/2015 CLINICAL DATA:  First trimester pregnancy with previously demonstrated moderate subchorionic hematoma. Confirm viability. EXAM: TRANSVAGINAL OB ULTRASOUND TECHNIQUE: Transvaginal ultrasound was performed for complete evaluation of the gestation as well as the maternal uterus, adnexal regions, and pelvic cul-de-sac. COMPARISON:  Prior study 08/06/2015. FINDINGS: Intrauterine gestational sac: Visualized/normal in shape. Yolk sac:  Visualized. Embryo:  Visualized. Cardiac Activity: Visualized. Heart Rate: 144  bpm CRL:  13.3  mm; 7 w 4 d;                  US EDC: 03/31/2016 Subchorionic hemorrhage: No significant residual subchorionic hematoma demonstrated. Maternal uterus/adnexae: Both maternal ovaries are visualized. Corpus luteum noted on the right. No adnexal mass. Trace free pelvic fluid. IMPRESSION: 1. Single live intrauterine embryo. 2. No significant residual subchorionic hematoma. Electronically Signed   By: Carey BullocksWilliam  Veazey M.D.   On: 08/17/2015 09:22   [place] Ob Transvaginal  Result Date: 08/06/2015 CLINICAL DATA:  Early pregnancy.  Evaluate viability.  Dating. LMP was6 2017. Gestational age by LMP is6 weeks  2 days. EDC by LMP is03/27/2018. EXAM: OBSTETRIC <14 WK US AND TRANSVAGINAL OB US TECHNIQUE: Both transabdominal and transvaginal ultrasound examinations were performed for complete evaluation of the gestation as well as the maternal uterus, adnexal regions, and pelvic  cul-de-sac. Transvaginal technique was performed to assess early pregnancy. COMPARISON:  04/13/2012 FINDINGS: Intrauterine gestational sac: Present Yolk sac:  Present Embryo:  Present Cardiac Activity: Present Heart Rate: 106  bpm CRL:  2.0  mm   5 w   5 d                  US EDC: 04/02/2016 Subchorionic hemorrhage: Moderate subchorionic hemorrhage is present. Maternal uterus/adnexae: Ovaries are normal in appearance. Trace free pelvic fluid. IMPRESSION: 1. Single living intrauterine embryo. Moderate subchorionic hemorrhage. 2. Size and dates correlate well. Today's exam confirms clinical EDC of 03/29/2016. Electronically Signed   By: Norva PavlovElizabeth  Brown M.D.   On: 08/06/2015 15:18    Assessment & Plan:   1. Early stage of pregnancy - Confirmed by US - Return in about 6 weeks for initial OB visit.  2. Language barrier, cultural differences   Routine preventative health maintenance measures emphasized.     Jen MowElizabeth Kyrstin Campillo, DO OB/GYN Fellow [facility]

## 2015-08-17 NOTE — Patient Instructions (Signed)
Morning Sickness °Morning sickness is when you feel sick to your stomach (nauseous) during pregnancy. You may feel sick to your stomach and throw up (vomit). You may feel sick in the morning, but you can feel this way any time of day. Some women feel very sick to their stomach and cannot stop throwing up (hyperemesis gravidarum). °HOME CARE °· Only take medicines as told by your doctor. °· Take multivitamins as told by your doctor. Taking multivitamins before getting pregnant can stop or lessen the harshness of morning sickness. °· Eat dry toast or unsalted crackers before getting out of bed. °· Eat 5 to 6 small meals a day. °· Eat dry and bland foods like rice and baked potatoes. °· Do not drink liquids with meals. Drink between meals. °· Do not eat greasy, fatty, or spicy foods. °· Have someone cook for you if the smell of food causes you to feel sick or throw up. °· If you feel sick to your stomach after taking prenatal vitamins, take them at night or with a snack. °· Eat protein when you need a snack (nuts, yogurt, cheese). °· Eat unsweetened gelatins for dessert. °· Wear a bracelet used for sea sickness (acupressure wristband). °· Go to a doctor that puts thin needles into certain body points (acupuncture) to improve how you feel. °· Do not smoke. °· Use a humidifier to keep the air in your house free of odors. °· Get lots of fresh air. °GET HELP IF: °· You need medicine to feel better. °· You feel dizzy or lightheaded. °· You are losing weight. °GET HELP RIGHT AWAY IF:  °· You feel very sick to your stomach and cannot stop throwing up. °· You pass out (faint). °MAKE SURE YOU: °· Understand these instructions. °· Will watch your condition. °· Will get help right away if you are not doing well or get worse. °  °This information is not intended to replace advice given to you by your health care provider. Make sure you discuss any questions you have with your health care provider. °  °Document Released: 01/28/2004  Document Revised: 01/10/2014 Document Reviewed: 06/06/2012 °Elsevier Interactive Patient Education ©2016 [employer] ° ° °SAFE MEDICATIONS IN PREGNANCY ° °Acne:  °Benzoyl Peroxide  °Salicylic Acid  ° °Backache/Headache:  °Tylenol: 2 regular strength every 4 hours OR  °             2 Extra strength every 6 hours  ° °Colds/Coughs/Allergies:  °Benadryl (alcohol free) 25 mg every 6 hours as needed  °Breath right strips  °Claritin  °Cepacol throat lozenges  °Chloraseptic throat spray  °Cold-Eeze- up to three times per day  °Cough drops, alcohol free  °Flonase (by prescription only)  °Guaifenesin  °Mucinex  °Robitussin DM (plain only, alcohol free)  °Saline nasal spray/drops  °Sudafed (pseudoephedrine) & Actifed * use only after [redacted] weeks gestation and if you do not have high blood pressure  °Tylenol  °Vicks Vaporub  °Zinc lozenges  °Zyrtec  ° °Constipation:  °Colace  °Ducolax suppositories  °Fleet enema  °Glycerin suppositories  °Metamucil  °Milk of magnesia  °Miralax  °Senokot  °Smooth move tea  ° °Diarrhea:  °Kaopectate  °Imodium A-D  ° °*NO pepto Bismol  ° °Hemorrhoids:  °Anusol  °Anusol HC  °Preparation H  °Tucks  ° °Indigestion:  °Tums  °Maalox  °Mylanta  °Zantac  °Pepcid  ° °Insomnia:  °Benadryl (alcohol free) 25mg every 6 hours as needed  °Tylenol PM  °Unisom, no Gelcaps  ° °Leg Cramps:  °Tums  °MagGel  ° °  MagGel   Nausea/Vomiting:  Bonine  Dramamine  Emetrol  Ginger extract  Sea bands  Meclizine   Nausea medication to take during pregnancy:  Unisom (doxylamine succinate 25 mg tablets) Take one tablet daily at bedtime. If symptoms are not adequately controlled, the dose can be increased to a maximum recommended dose of two tablets daily (1/2 tablet in the morning, 1/2 tablet mid-afternoon and one at bedtime).  Vitamin B6 100mg  tablets. Take one tablet twice a day (up to 200 mg per day).   Skin Rashes:  Aveeno products  Benadryl cream or 25mg  every 6 hours as needed  Calamine Lotion  1% cortisone  cream   Yeast infection:  Gyne-lotrimin 7  Monistat 7    **If taking multiple medications, please check labels to avoid duplicating the same active ingredients  **take medication as directed on the label  ** Do not exceed 4000 mg of tylenol in 24 hours  **Do not take medications that contain aspirin or ibuprofen

## 2015-09-14 ENCOUNTER — Encounter: Payer: Self-pay | Admitting: Obstetrics & Gynecology

## 2015-09-14 ENCOUNTER — Ambulatory Visit (INDEPENDENT_AMBULATORY_CARE_PROVIDER_SITE_OTHER): Payer: Medicaid Other | Admitting: Obstetrics & Gynecology

## 2015-09-14 VITALS — BP 119/67 | HR 75 | Wt 96.1 lb

## 2015-09-14 DIAGNOSIS — Z36 Encounter for antenatal screening of mother: Secondary | ICD-10-CM | POA: Diagnosis not present

## 2015-09-14 DIAGNOSIS — Z23 Encounter for immunization: Secondary | ICD-10-CM

## 2015-09-14 DIAGNOSIS — Z113 Encounter for screening for infections with a predominantly sexual mode of transmission: Secondary | ICD-10-CM

## 2015-09-14 DIAGNOSIS — Z3491 Encounter for supervision of normal pregnancy, unspecified, first trimester: Secondary | ICD-10-CM | POA: Diagnosis not present

## 2015-09-14 DIAGNOSIS — Z3689 Encounter for other specified antenatal screening: Secondary | ICD-10-CM

## 2015-09-14 DIAGNOSIS — E049 Nontoxic goiter, unspecified: Secondary | ICD-10-CM | POA: Diagnosis not present

## 2015-09-14 LAB — POCT URINALYSIS DIP (DEVICE)
Bilirubin Urine: NEGATIVE
GLUCOSE, UA: NEGATIVE mg/dL
HGB URINE DIPSTICK: NEGATIVE
KETONES UR: NEGATIVE mg/dL
Leukocytes, UA: NEGATIVE
NITRITE: NEGATIVE
Protein, ur: NEGATIVE mg/dL
SPECIFIC GRAVITY, URINE: 1.025 (ref 1.005–1.030)
Urobilinogen, UA: 0.2 mg/dL (ref 0.0–1.0)
pH: 6.5 (ref 5.0–8.0)

## 2015-09-14 NOTE — Progress Notes (Signed)
  Subjective:    Brooke Perry is a G2P1001 4635w4d being seen today for her first obstetrical visit.  Her obstetrical history is significant for underweight. Patient does intend to breast feed. Pregnancy history fully reviewed.  Patient reports nausea and vomiting but does not want to take meds.  Vitals:   09/14/15 0819  BP: 119/67  Pulse: 75  Weight: 96 lb 1.9 oz (43.6 kg)    HISTORY: OB History  Gravida Para Term Preterm AB Living  2 1 1     1   SAB TAB Ectopic Multiple Live Births          1    # Outcome Date GA Lbr Len/2nd Weight Sex Delivery Anes PTL Lv  2 Current           1 Term 09/12/12 8268w0d 10:17 / 00:20 7 lb 1.2 oz (3.209 kg) M Vag-Spont Local  LIV     Past Medical History:  Diagnosis Date  . Abnormal vaginal bleeding   . Breakthrough bleeding on Nexplanon 04/09/2014  . GERD (gastroesophageal reflux disease)    Past Surgical History:  Procedure Laterality Date  . NO PAST SURGERIES     History reviewed. No pertinent family history.   Exam    Uterus:  Fundal Height: 154 cm  Pelvic Exam:    Perineum: No Hemorrhoids   Vulva: normal   Vagina:  normal mucosa, normal discharge   pH: N/a   Cervix: no cervical motion tenderness and no lesions   Adnexa: normal adnexa   Bony Pelvis: gynecoid  System: Breast:  normal appearance, no masses or tenderness   Skin: normal coloration and turgor, no rashes    Neurologic: oriented, normal mood   Extremities: normal strength, tone, and muscle mass, no deformities, no erythema, induration, or nodules   HEENT sclera clear, anicteric, oropharynx clear, no lesions, neck supple with midline trachea, trachea midline and thyroid seems enlarged-adding on TSH   Mouth/Teeth mucous membranes moist, pharynx normal without lesions and dental hygiene good   Neck supple   Cardiovascular: regular rate and rhythm   Respiratory:  appears well, vitals normal, no respiratory distress, acyanotic, normal RR, chest clear, no wheezing,  crepitations, rhonchi, normal symmetric air entry   Abdomen: soft, non-tender; bowel sounds normal; no masses,  no organomegaly   Urinary: urethral meatus normal      Assessment:    Pregnancy: G2P1001 Patient Active Problem List   Diagnosis Date Noted  . Early stage of pregnancy 08/06/2015  . Language barrier, cultural differences 08/01/2012        Plan:  Initial labs drawn. Prenatal vitamins. Problem list reviewed and updated. Genetic Screening discussed First Screen: ordered.  Ultrasound discussed; fetal survey: ordered.  Follow up in 4 weeks. Nutrition consult for underweight if does not gain weight the first few visits. Enlarged thyroid--TSH drawn.   Brooke Pryde H. 09/14/2015

## 2015-09-14 NOTE — Progress Notes (Signed)
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  Anatomy scheduled for November 6th @ 0845.  Pt notified.

## 2015-09-15 DIAGNOSIS — E049 Nontoxic goiter, unspecified: Secondary | ICD-10-CM | POA: Insufficient documentation

## 2015-09-15 LAB — PRENATAL PROFILE (SOLSTAS)
ANTIBODY SCREEN: NEGATIVE
BASOS PCT: 0 %
Basophils Absolute: 0 cells/uL (ref 0–200)
EOS PCT: 0 %
Eosinophils Absolute: 0 cells/uL — ABNORMAL LOW (ref 15–500)
HCT: 33.9 % — ABNORMAL LOW (ref 35.0–45.0)
HEMOGLOBIN: 11.5 g/dL — AB (ref 11.7–15.5)
HIV 1&2 Ab, 4th Generation: NONREACTIVE
Hepatitis B Surface Ag: NEGATIVE
Lymphocytes Relative: 24 %
Lymphs Abs: 1512 cells/uL (ref 850–3900)
MCH: 28.5 pg (ref 27.0–33.0)
MCHC: 33.9 g/dL (ref 32.0–36.0)
MCV: 84.1 fL (ref 80.0–100.0)
MONOS PCT: 6 %
MPV: 9 fL (ref 7.5–12.5)
Monocytes Absolute: 378 cells/uL (ref 200–950)
NEUTROS ABS: 4410 {cells}/uL (ref 1500–7800)
Neutrophils Relative %: 70 %
Platelets: 258 10*3/uL (ref 140–400)
RBC: 4.03 MIL/uL (ref 3.80–5.10)
RDW: 13.2 % (ref 11.0–15.0)
RH TYPE: NEGATIVE
Rubella: 22.4 Index — ABNORMAL HIGH (ref <0.90)
WBC: 6.3 10*3/uL (ref 3.8–10.8)

## 2015-09-15 LAB — GC/CHLAMYDIA PROBE AMP (~~LOC~~) NOT AT ARMC
Chlamydia: NEGATIVE
Neisseria Gonorrhea: NEGATIVE

## 2015-09-15 LAB — CYTOLOGY - PAP

## 2015-09-15 LAB — CULTURE, OB URINE: ORGANISM ID, BACTERIA: NO GROWTH

## 2015-09-17 ENCOUNTER — Encounter: Payer: Self-pay | Admitting: Obstetrics & Gynecology

## 2015-09-17 DIAGNOSIS — Z3490 Encounter for supervision of normal pregnancy, unspecified, unspecified trimester: Secondary | ICD-10-CM | POA: Insufficient documentation

## 2015-09-21 ENCOUNTER — Ambulatory Visit (HOSPITAL_COMMUNITY)
Admission: RE | Admit: 2015-09-21 | Discharge: 2015-09-21 | Disposition: A | Discharge status: Home / Self Care | Payer: Medicaid Other | Source: Ambulatory Visit | Attending: Obstetrics & Gynecology | Admitting: Obstetrics & Gynecology

## 2015-09-21 ENCOUNTER — Encounter (HOSPITAL_COMMUNITY): Payer: Self-pay

## 2015-09-21 DIAGNOSIS — Z3A12 12 weeks gestation of pregnancy: Secondary | ICD-10-CM | POA: Diagnosis not present

## 2015-09-21 DIAGNOSIS — Z36 Encounter for antenatal screening of mother: Secondary | ICD-10-CM | POA: Insufficient documentation

## 2015-09-21 DIAGNOSIS — Z3491 Encounter for supervision of normal pregnancy, unspecified, first trimester: Secondary | ICD-10-CM

## 2015-10-01 ENCOUNTER — Other Ambulatory Visit (HOSPITAL_COMMUNITY): Payer: Self-pay

## 2015-10-12 ENCOUNTER — Ambulatory Visit (INDEPENDENT_AMBULATORY_CARE_PROVIDER_SITE_OTHER): Payer: Medicaid Other | Admitting: Obstetrics & Gynecology

## 2015-10-12 VITALS — BP 95/54 | HR 73 | Wt 98.0 lb

## 2015-10-12 DIAGNOSIS — E049 Nontoxic goiter, unspecified: Secondary | ICD-10-CM | POA: Diagnosis not present

## 2015-10-12 DIAGNOSIS — O99282 Endocrine, nutritional and metabolic diseases complicating pregnancy, second trimester: Secondary | ICD-10-CM

## 2015-10-12 DIAGNOSIS — Z3492 Encounter for supervision of normal pregnancy, unspecified, second trimester: Secondary | ICD-10-CM

## 2015-10-12 LAB — TSH: TSH: 0.49 mIU/L

## 2015-10-12 NOTE — Progress Notes (Signed)
   PRENATAL VISIT NOTE  Subjective:  Brooke Perry is a 25 y.o. G2P1001 at 521w4d being seen today for ongoing prenatal care.  She is currently monitored for the following issues for this low-risk pregnancy and has Language barrier, cultural differences; Enlarged thyroid gland; and Supervision of low-risk pregnancy on her problem list.  Patient reports round ligament pain.  Contractions: Not present. Vag. Bleeding: None.  Movement: Present. Denies leaking of fluid.   The following portions of the patient's history were reviewed and updated as appropriate: allergies, current medications, past family history, past medical history, past social history, past surgical history and problem list. Problem list updated.  Objective:   Vitals:   10/12/15 1003  BP: (!) 95/54  Pulse: 73  Weight: 98 lb (44.5 kg)    Fetal Status: Fetal Heart Rate (bpm): 150   Movement: Present     General:  Alert, oriented and cooperative. Patient is in no acute distress.  Skin: Skin is warm and dry. No rash noted.   Cardiovascular: Normal heart rate noted  Respiratory: Normal respiratory effort, no problems with respiration noted  Abdomen: Soft, gravid, appropriate for gestational age. Pain/Pressure: Absent     Pelvic:  Cervical exam deferred        Extremities: Normal range of motion.  Edema: None  Mental Status: Normal mood and affect. Normal behavior. Normal judgment and thought content.     Assessment and Plan:  Pregnancy: G2P1001 at 591w4d  1. Encounter for supervision of low-risk pregnancy in second trimester - Hemoglobinopathy Evaluation - TSH - Alpha fetoprotein, maternal - US MFM OB DETAIL +14 WK; Future  2. Enlarged thyroid gland -TSH  Preterm labor symptoms and general obstetric precautions including but not limited to vaginal bleeding, contractions, leaking of fluid and fetal movement were reviewed in detail with the patient. Please refer to After Visit Summary for other counseling  recommendations.  Return in 4 weeks (on 11/09/2015).  Lesly DukesKelly H Leggett, MD

## 2015-10-13 LAB — ALPHA FETOPROTEIN, MATERNAL
AFP: 35.3 ng/mL
CURR GEST AGE: 15.6 wk
MoM for AFP: 0.96
Open Spina bifida: NEGATIVE
Osb Risk: 1:15200 {titer}

## 2015-10-19 LAB — HEMOGLOBINOPATHY EVALUATION
HCT: 33.1 % — ABNORMAL LOW (ref 35.0–45.0)
HGB A2 QUANT: 2.2 % (ref 1.8–3.5)
HGB A: 96.8 % (ref >96.0)
Hemoglobin: 11 g/dL — ABNORMAL LOW (ref 11.7–15.5)
MCH: 28.4 pg (ref 27.0–33.0)
MCV: 85.5 fL (ref 80.0–100.0)
RBC: 3.87 MIL/uL (ref 3.80–5.10)
RDW: 14 % (ref 11.0–15.0)

## 2015-11-09 ENCOUNTER — Encounter: Payer: Self-pay | Admitting: Obstetrics & Gynecology

## 2015-11-09 ENCOUNTER — Ambulatory Visit (HOSPITAL_COMMUNITY): Admission: RE | Admit: 2015-11-09 | Payer: Self-pay | Source: Ambulatory Visit

## 2015-11-30 ENCOUNTER — Encounter: Payer: Self-pay | Admitting: Certified Nurse Midwife

## 2016-07-19 ENCOUNTER — Encounter (HOSPITAL_COMMUNITY): Payer: Self-pay

## 2016-10-24 IMAGING — US US OB COMP LESS 14 WK
1 series · 15 of 28 positions shown · non-contrast
Comparison: 04/13/2012

CLINICAL DATA: Early pregnancy.  Evaluate viability.  Dating.

LMP was6 4845.
Gestational age by LMP is6 weeks 2 days.
EDC by LMP is03/29/2016.
EXAM:
OBSTETRIC <14 WK US AND TRANSVAGINAL OB US
TECHNIQUE: Both transabdominal and transvaginal ultrasound examinations were
performed for complete evaluation of the gestation as well as the
maternal uterus, adnexal regions, and pelvic cul-de-sac.
Transvaginal technique was performed to assess early pregnancy.

[Series 1: us ob comp less 14 wk · 68 acquisitions, 15 frames shown]
[im 1/68]
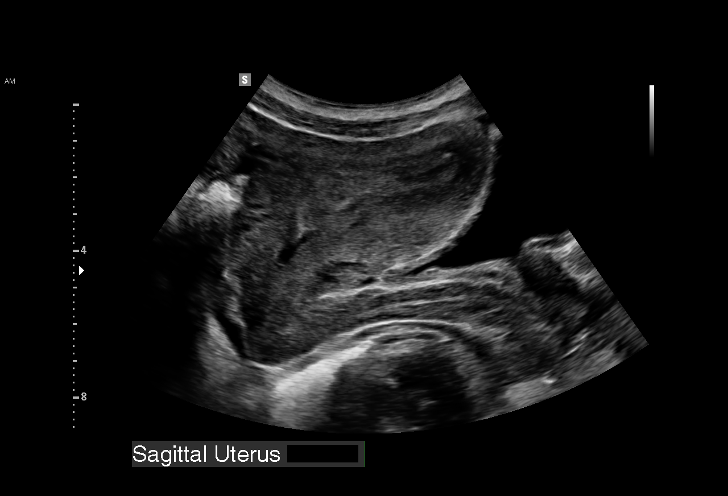
[im 5/68]
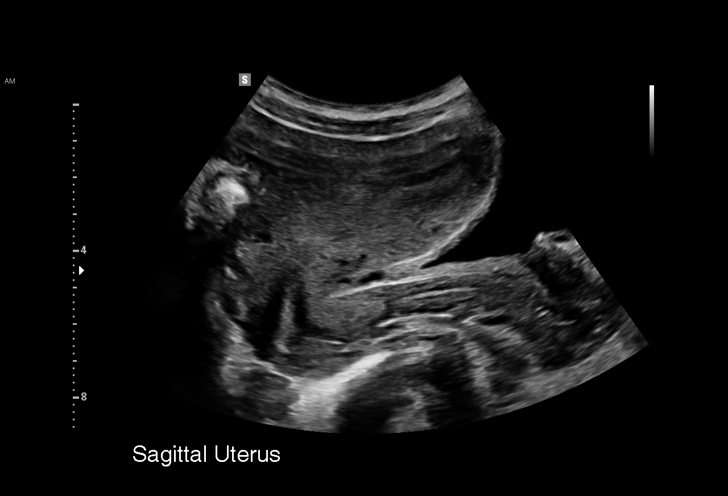
[im 10/68]
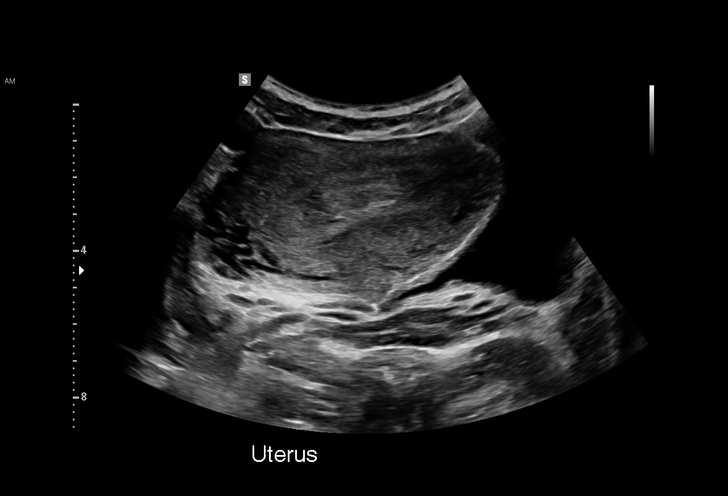
[im 15/68]
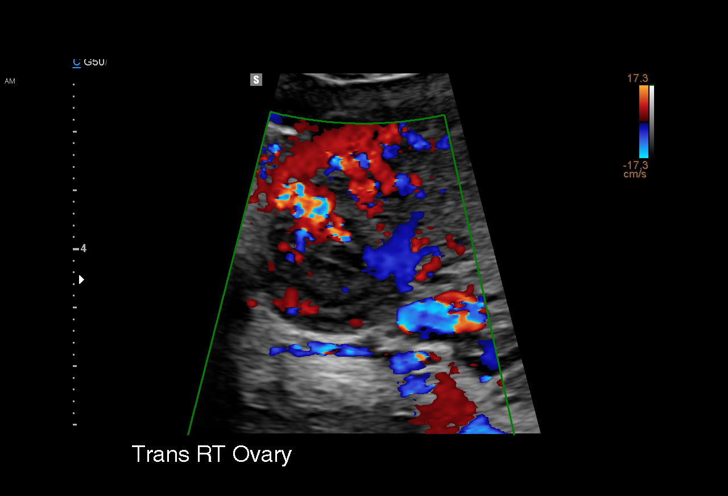
[im 20/68]
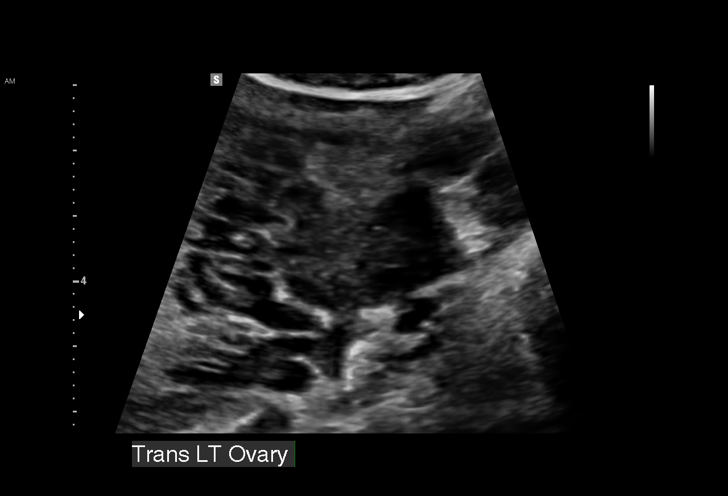
[im 25/68]
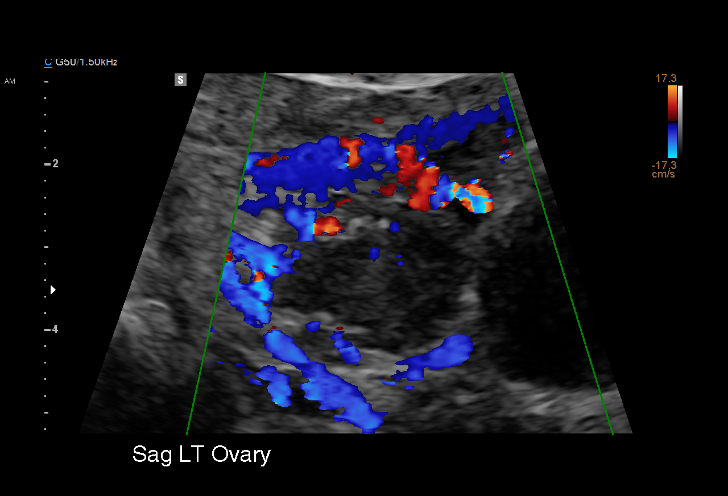
[im 30/68]
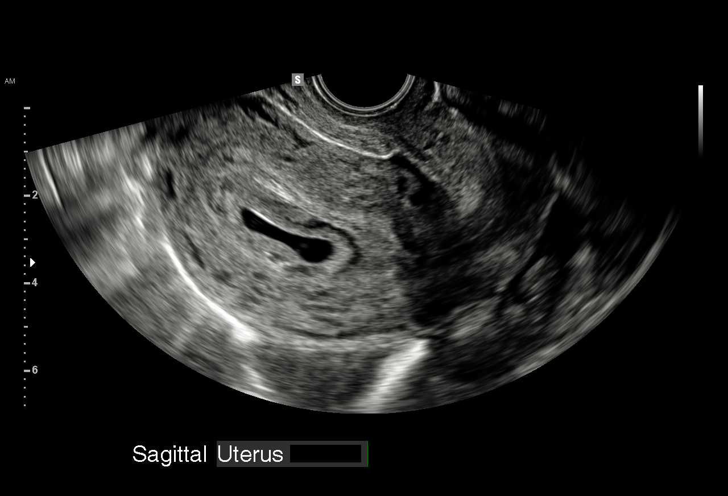
[im 35/68]
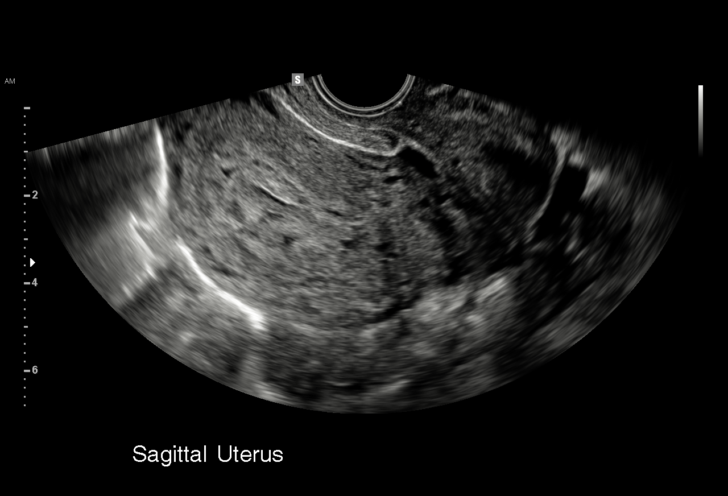
[im 38/68]
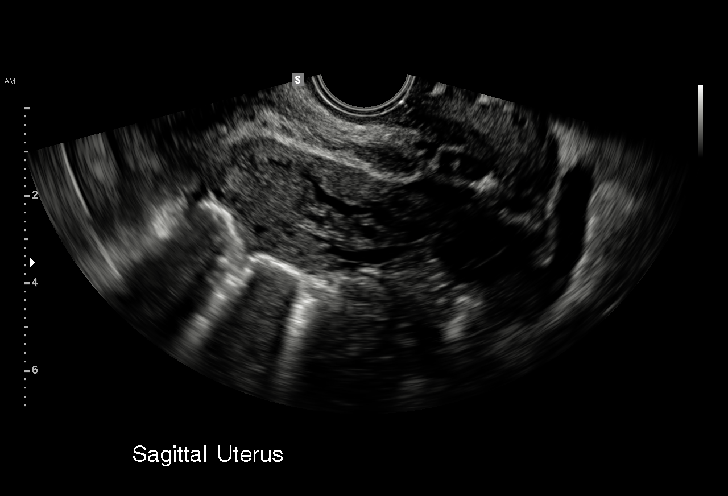
[im 43/68]
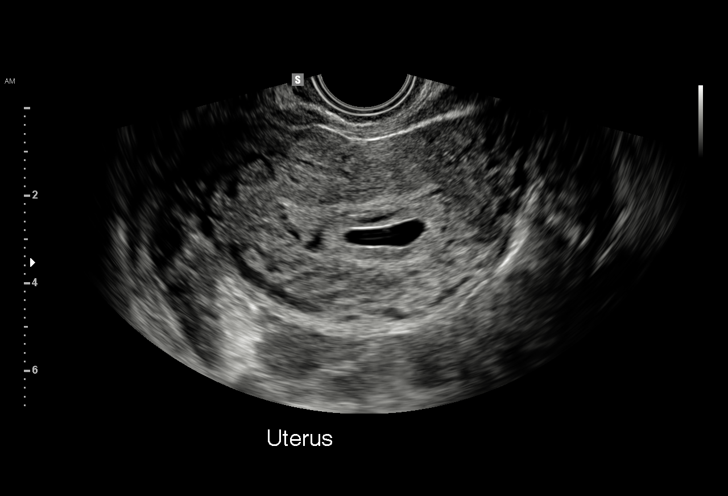
[im 48/68]
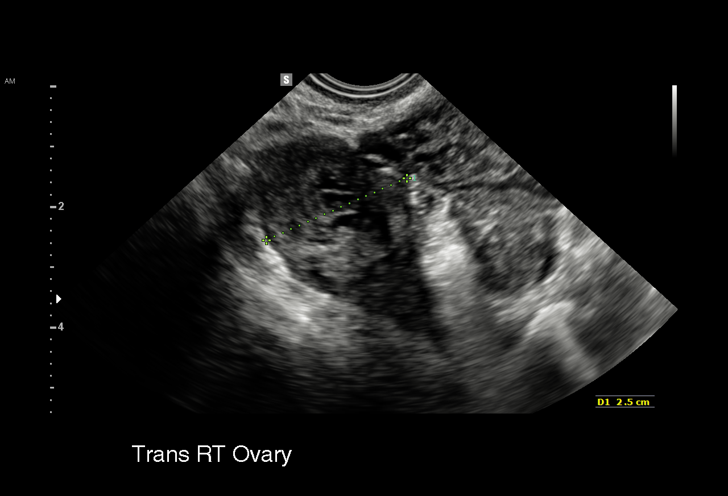
[im 53/68]
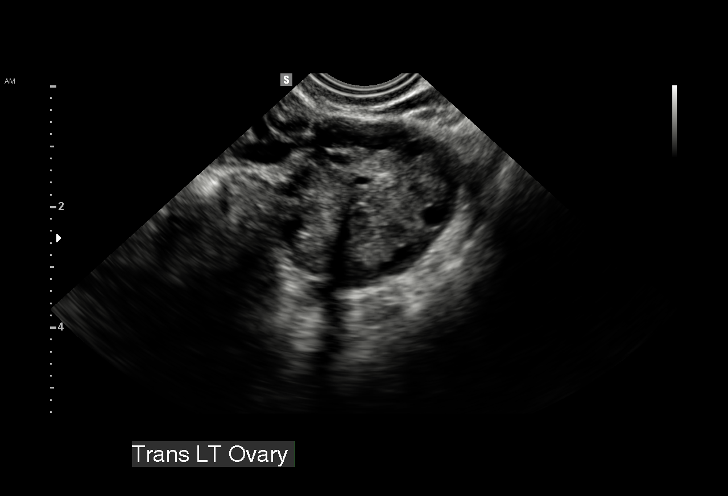
[im 58/68]
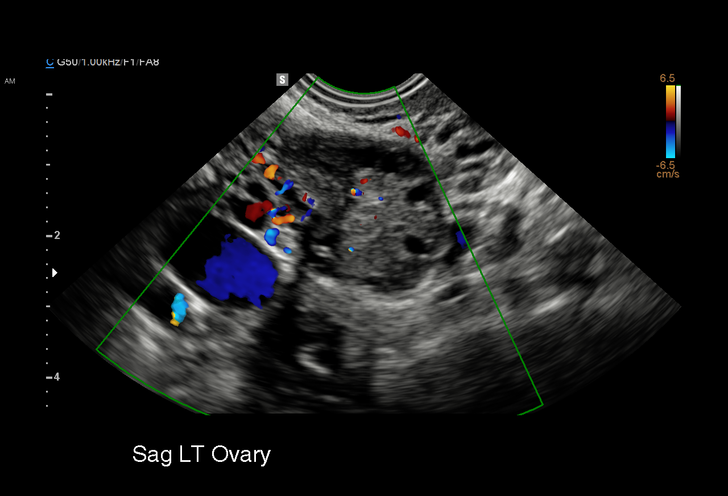
[im 63/68]
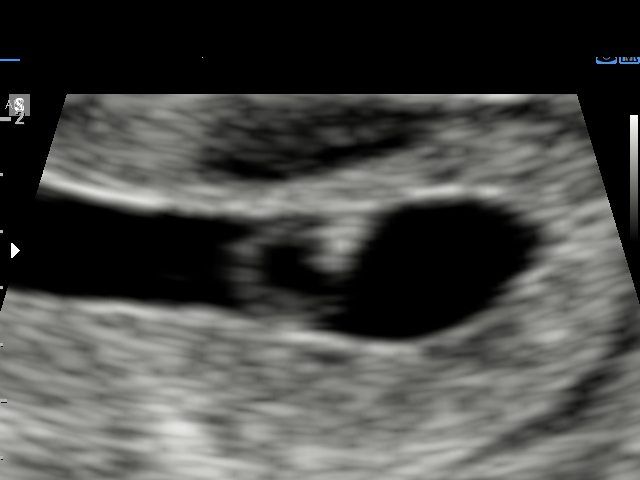
[im 68/68]
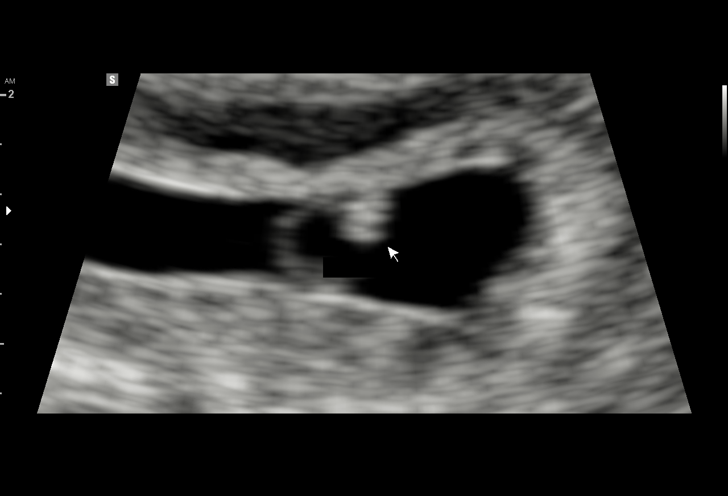

[15 of 28 positions shown; findings below may reference images not displayed]

FINDINGS: Intrauterine gestational sac: Present

Yolk sac:  Present

Embryo:  Present

Cardiac Activity: Present

Heart Rate: 106  bpm

CRL:  2.0  mm   5 w   5 d                  US EDC: 04/02/2016

Subchorionic hemorrhage: Moderate subchorionic hemorrhage is
present.

Maternal uterus/adnexae: Ovaries are normal in appearance. Trace
free pelvic fluid.
IMPRESSION: 1. Single living intrauterine embryo. Moderate subchorionic
hemorrhage.
2. Size and dates correlate well. Today's exam confirms clinical EDC
of 03/29/2016.

## 2016-12-09 IMAGING — US US MFM FETAL NUCHAL TRANSLUCENCY
1 series · 15 of 28 positions shown · non-contrast
Comparison: none

[Series 1: us mfm fetal nuchal translucency · 15 of 33 slices shown]
[im 1/33]
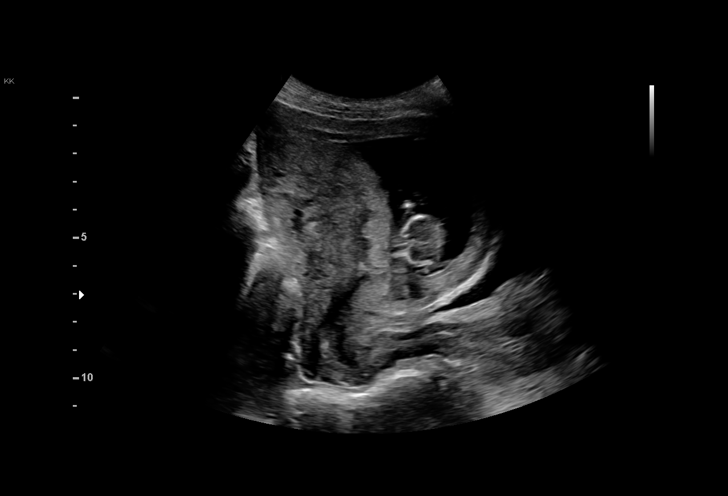
[im 3/33]
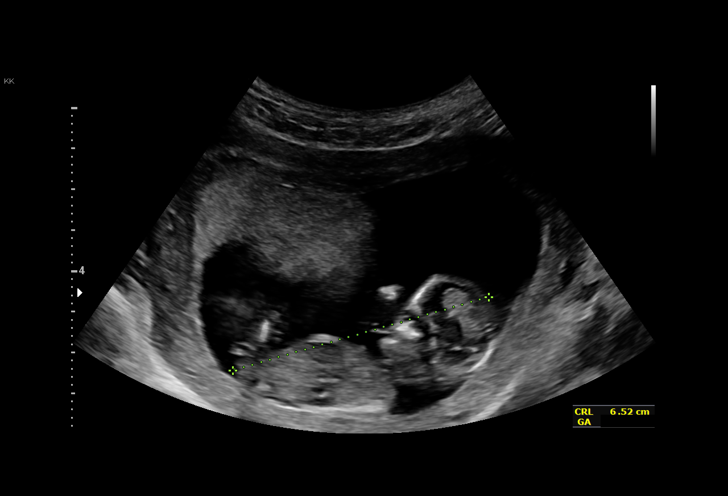
[im 5/33]
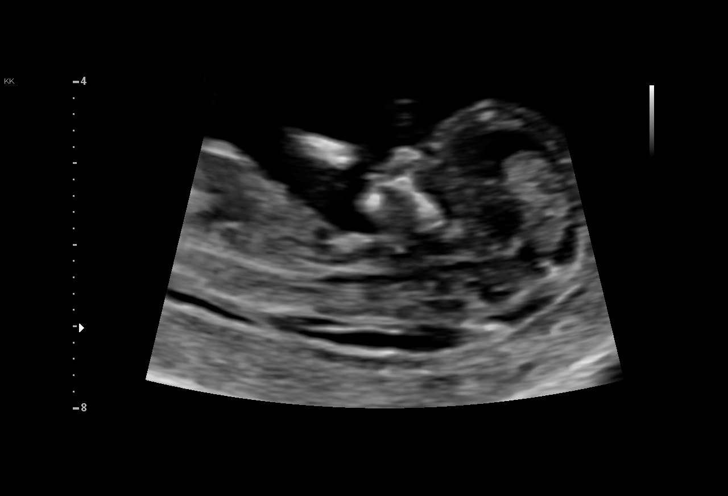
[im 8/33]
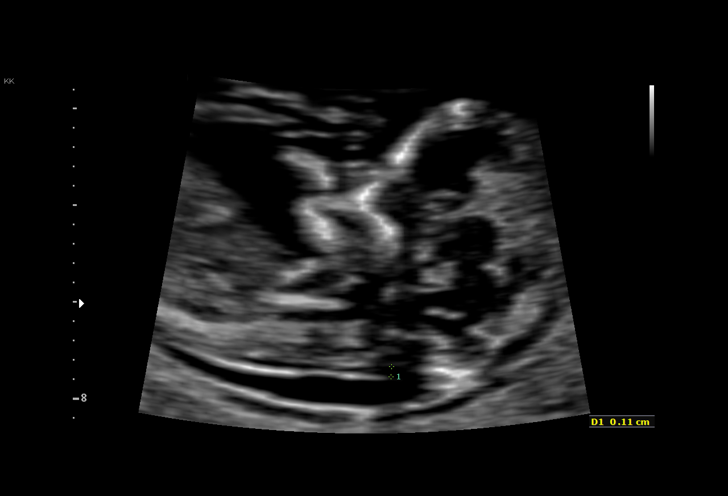
[im 10/33]
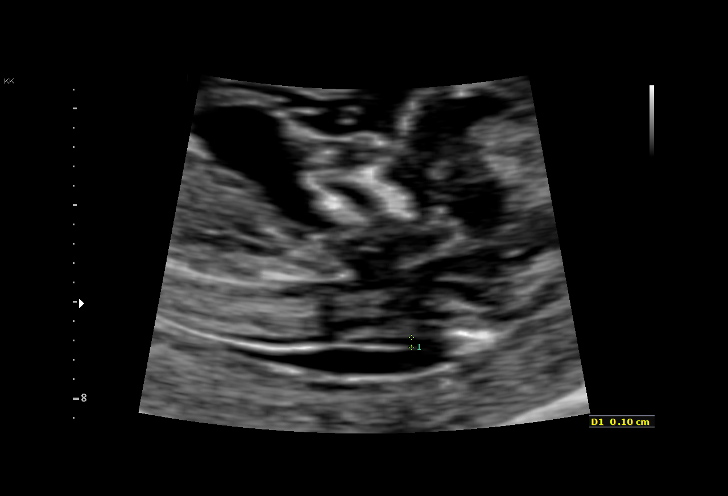
[im 12/33]
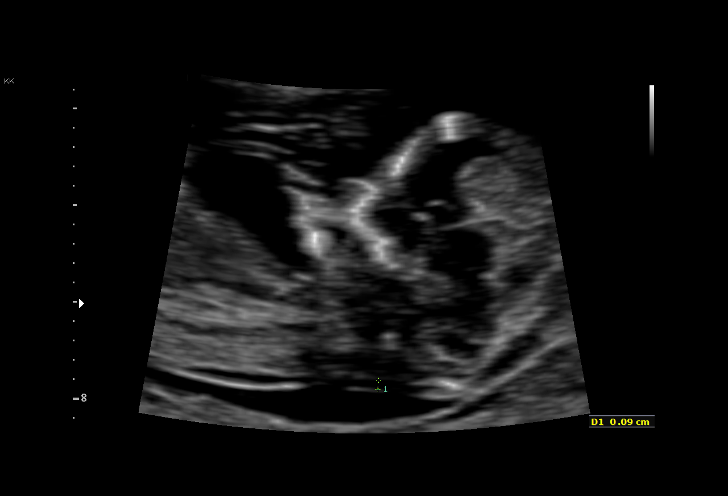
[im 15/33]
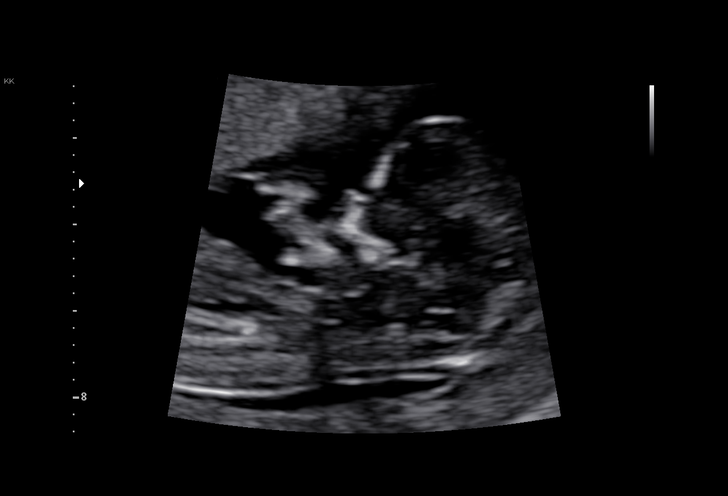
[im 17/33]
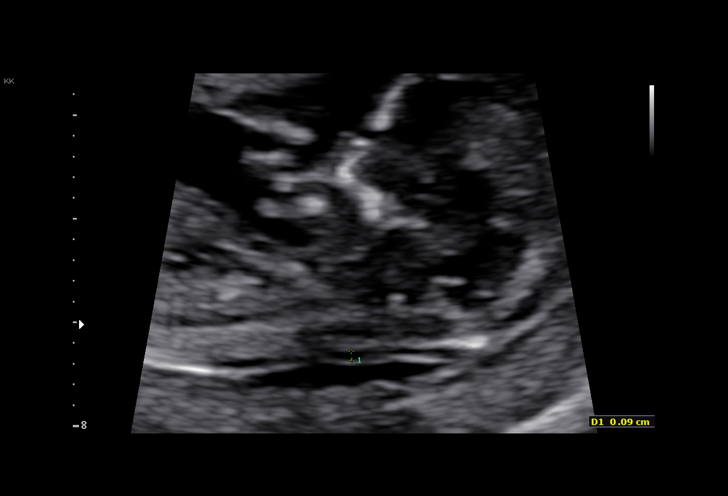
[im 18/33]
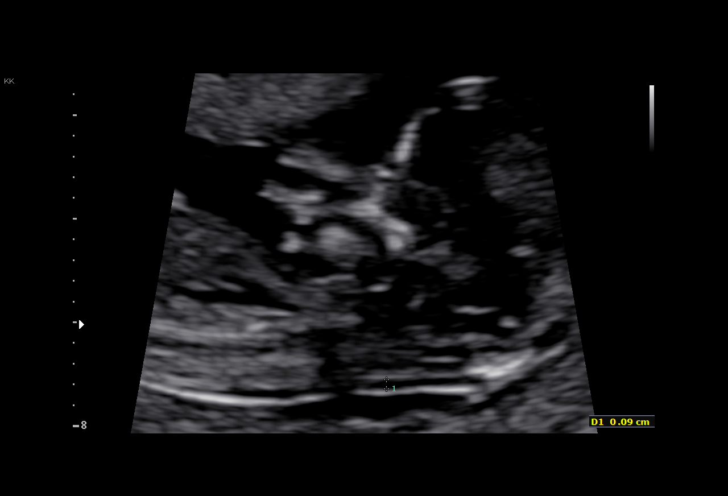
[im 21/33]
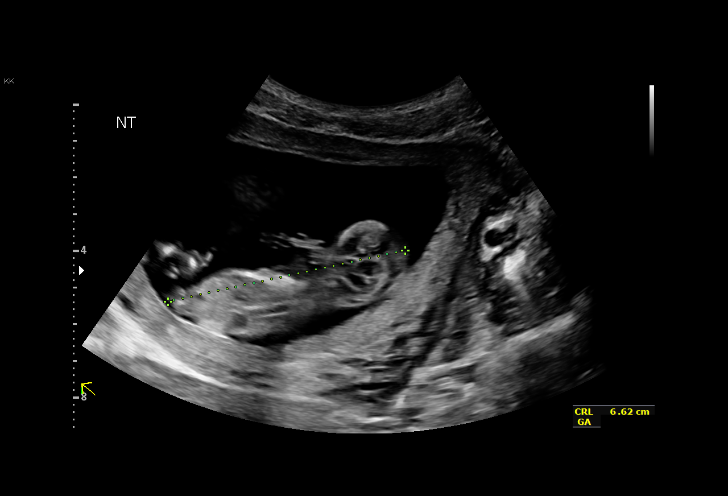
[im 23/33]
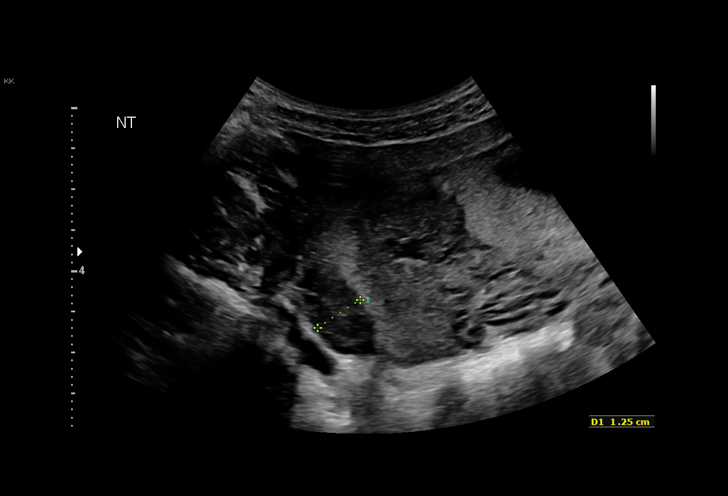
[im 25/33]
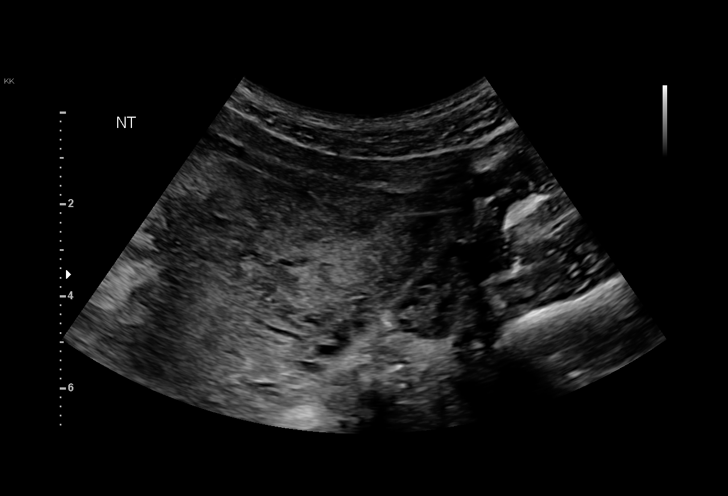
[im 28/33]
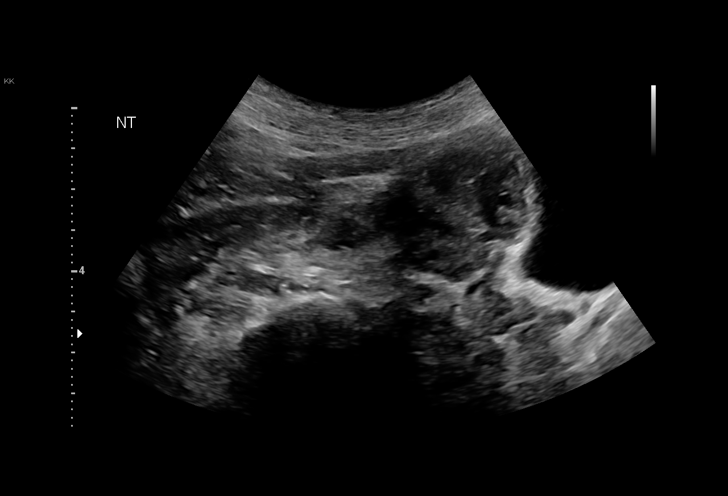
[im 30/33]
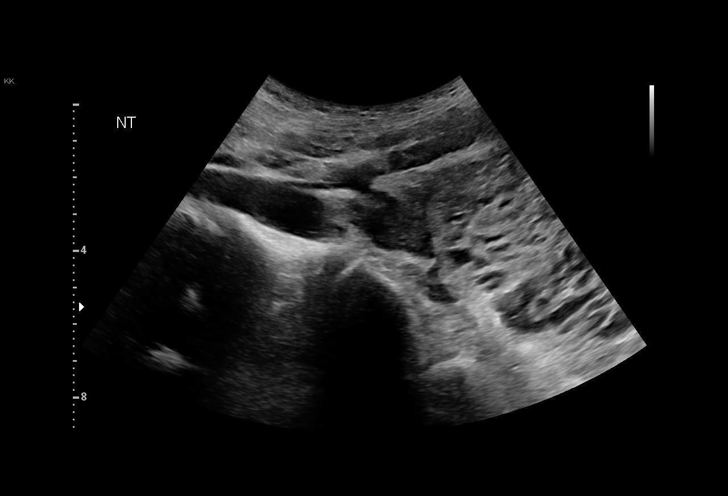
[im 33/33]
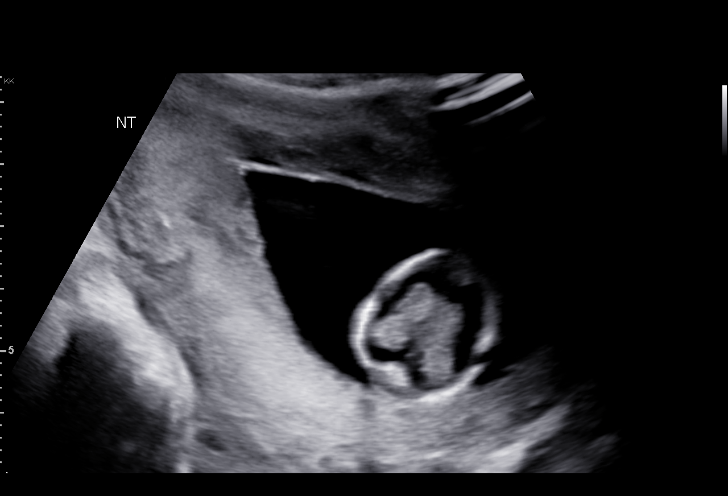

[15 of 28 positions shown; findings below may reference images not displayed]

TRANSLUCENCY

1  RUDHY EZEKIEL            [id number]      [id number]     [id number]
Indications

12 weeks gestation of pregnancy
First trimester aneuploidy screen (NT)         Z36
OB History

Blood Type:            Height:  5'2"   Weight (lb):  95       BMI:
Gravidity:    2         Term:   1        Prem:   0        SAB:   0
TOP:          0       Ectopic:  0        Living: 1
Fetal Evaluation

Num Of Fetuses:     1
Fetal Heart         142
Rate(bpm):
Cardiac Activity:   Observed
Presentation:       Variable

Amniotic Fluid
AFI FV:      Subjectively within normal limits
Biometry

CRL:      66.5  mm     G. Age:  12w 6d                  EDD:   03/29/16
Gestational Age

LMP:           12w 6d       Date:   06/23/15                 EDD:   03/29/16
Best:          12w 6d    Det. By:   LMP  (06/23/15)          EDD:   03/29/16
1st Trimester Genetic Sonogram Screening
CRL:            66.5  mm    G. Age:   12w 6d                 EDD:   03/29/16
Nuc Trans:       1.1  mm
Cervix Uterus Adnexa

Cervix
Normal appearance by transabdominal scan.

Left Ovary
Within normal limits.

Right Ovary
Within normal limits.
Impression

SIUP at 12+6 weeks
No gross abnormalities identified
NT measurement was within normal limits for this GA; NB
present
Normal amniotic fluid volume
Measurements consistent with LMP dating
Recommendations

Offer MSAFP in the second trimester for ONTD screening
Offer anatomy U/S by 18 weeks

## 2017-10-26 ENCOUNTER — Encounter: Payer: Self-pay | Admitting: *Deleted

## 2018-06-01 ENCOUNTER — Encounter: Payer: Self-pay | Admitting: *Deleted

## 2018-07-10 ENCOUNTER — Encounter: Payer: Self-pay | Admitting: *Deleted

## 2019-08-14 ENCOUNTER — Emergency Department (HOSPITAL_COMMUNITY)
Admission: EM | Admit: 2019-08-14 | Discharge: 2019-08-15 | Disposition: A | Discharge status: Home / Self Care | Payer: Self-pay | Attending: Emergency Medicine | Admitting: Emergency Medicine

## 2019-08-14 ENCOUNTER — Emergency Department (HOSPITAL_COMMUNITY): Payer: Self-pay

## 2019-08-14 ENCOUNTER — Other Ambulatory Visit: Payer: Self-pay

## 2019-08-14 DIAGNOSIS — R1013 Epigastric pain: Secondary | ICD-10-CM | POA: Insufficient documentation

## 2019-08-14 DIAGNOSIS — R109 Unspecified abdominal pain: Secondary | ICD-10-CM | POA: Insufficient documentation

## 2019-08-14 DIAGNOSIS — K219 Gastro-esophageal reflux disease without esophagitis: Secondary | ICD-10-CM | POA: Insufficient documentation

## 2019-08-14 DIAGNOSIS — R0602 Shortness of breath: Secondary | ICD-10-CM | POA: Insufficient documentation

## 2019-08-14 LAB — I-STAT BETA HCG BLOOD, ED (MC, WL, AP ONLY): I-stat hCG, quantitative: <5 m[IU]/mL (ref <5)

## 2019-08-14 NOTE — ED Triage Notes (Addendum)
Pt c/o CP/epigastric pain and SOB x1 month. Pt repeatedly answers cell phone in triage, difficult time obtaining EKG

## 2019-08-15 ENCOUNTER — Emergency Department (HOSPITAL_COMMUNITY): Payer: Self-pay

## 2019-08-15 LAB — HEPATIC FUNCTION PANEL
ALT: 23 U/L (ref 0–44)
AST: 20 U/L (ref 15–41)
Albumin: 3.3 g/dL — ABNORMAL LOW (ref 3.5–5.0)
Alkaline Phosphatase: 48 U/L (ref 38–126)
Bilirubin, Direct: <0.1 mg/dL (ref 0.0–0.2)
Total Bilirubin: 0.2 mg/dL — ABNORMAL LOW (ref 0.3–1.2)
Total Protein: 7.3 g/dL (ref 6.5–8.1)

## 2019-08-15 LAB — CBC
HCT: 35.5 % — ABNORMAL LOW (ref 36.0–46.0)
Hemoglobin: 10.5 g/dL — ABNORMAL LOW (ref 12.0–15.0)
MCH: 22.1 pg — ABNORMAL LOW (ref 26.0–34.0)
MCHC: 29.6 g/dL — ABNORMAL LOW (ref 30.0–36.0)
MCV: 74.7 fL — ABNORMAL LOW (ref 80.0–100.0)
Platelets: 435 10*3/uL — ABNORMAL HIGH (ref 150–400)
RBC: 4.75 MIL/uL (ref 3.87–5.11)
RDW: 16.9 % — ABNORMAL HIGH (ref 11.5–15.5)
WBC: 15.7 10*3/uL — ABNORMAL HIGH (ref 4.0–10.5)
nRBC: 0 % (ref 0.0–0.2)

## 2019-08-15 LAB — I-STAT BETA HCG BLOOD, ED (MC, WL, AP ONLY): I-stat hCG, quantitative: <5 m[IU]/mL (ref <5)

## 2019-08-15 LAB — BASIC METABOLIC PANEL
Anion gap: 11 (ref 5–15)
BUN: 15 mg/dL (ref 6–20)
CO2: 23 mmol/L (ref 22–32)
Calcium: 9.2 mg/dL (ref 8.9–10.3)
Chloride: 102 mmol/L (ref 98–111)
Creatinine, Ser: 0.68 mg/dL (ref 0.44–1.00)
GFR calc Af Amer: >60 mL/min (ref >60)
GFR calc non Af Amer: >60 mL/min (ref >60)
Glucose, Bld: 123 mg/dL — ABNORMAL HIGH (ref 70–99)
Potassium: 3.8 mmol/L (ref 3.5–5.1)
Sodium: 136 mmol/L (ref 135–145)

## 2019-08-15 LAB — URINALYSIS, ROUTINE W REFLEX MICROSCOPIC
Bacteria, UA: NONE SEEN
Bilirubin Urine: NEGATIVE
Glucose, UA: NEGATIVE mg/dL
Ketones, ur: NEGATIVE mg/dL
Leukocytes,Ua: NEGATIVE
Nitrite: NEGATIVE
Protein, ur: 100 mg/dL — AB
RBC / HPF: >50 RBC/hpf — ABNORMAL HIGH (ref 0–5)
Specific Gravity, Urine: 1.033 — ABNORMAL HIGH (ref 1.005–1.030)
pH: 6 (ref 5.0–8.0)

## 2019-08-15 LAB — LIPASE, BLOOD: Lipase: 31 U/L (ref 11–51)

## 2019-08-15 LAB — TROPONIN I (HIGH SENSITIVITY)
Troponin I (High Sensitivity): 4 ng/L (ref <18)
Troponin I (High Sensitivity): 3 ng/L (ref <18)

## 2019-08-15 MED ORDER — SUCRALFATE 1 G PO TABS: 1.0000 g | ORAL_TABLET | Freq: Three times a day (TID) | ORAL | 0 refills | Status: AC

## 2019-08-15 MED ORDER — FAMOTIDINE 20 MG PO TABS
40.0000 mg | ORAL_TABLET | Freq: Once | ORAL | Status: AC
Start: 2019-08-15 — End: 2019-08-15
  Administered 2019-08-15: 40 mg via ORAL
  Filled 2019-08-15: qty 2

## 2019-08-15 MED ORDER — SODIUM CHLORIDE 0.9 % IV BOLUS
1000.0000 mL | Freq: Once | INTRAVENOUS | Status: AC
  Administered 2019-08-15: 1000 mL via INTRAVENOUS

## 2019-08-15 MED ORDER — MORPHINE SULFATE (PF) 4 MG/ML IV SOLN
4.0000 mg | Freq: Once | INTRAVENOUS | Status: AC
  Administered 2019-08-15: 4 mg via INTRAVENOUS
  Filled 2019-08-15: qty 1

## 2019-08-15 MED ORDER — OMEPRAZOLE 20 MG PO CPDR
20.0000 mg | DELAYED_RELEASE_CAPSULE | Freq: Every day | ORAL | 0 refills | Status: AC
Start: 2019-08-15 — End: ?

## 2019-08-15 MED ORDER — IOHEXOL 300 MG/ML  SOLN
100.0000 mL | Freq: Once | INTRAMUSCULAR | Status: AC | PRN
  Administered 2019-08-15: 100 mL via INTRAVENOUS

## 2019-08-15 MED ORDER — ALUM & MAG HYDROXIDE-SIMETH 200-200-20 MG/5ML PO SUSP
30.0000 mL | Freq: Once | ORAL | Status: AC
  Administered 2019-08-15: 30 mL via ORAL
  Filled 2019-08-15: qty 30

## 2019-08-15 NOTE — ED Notes (Signed)
Patient transported to CT 

## 2019-08-15 NOTE — Discharge Instructions (Signed)
Start Omeprazole once daily for pain Start Carafate 4 times daily for pain Please make an appointment with a gastroenterologist (stomach doctor)

## 2019-08-15 NOTE — ED Provider Notes (Signed)
[facility] EMERGENCY DEPARTMENT Provider Note   CSN: [id number] Arrival date & time: 08/14/19  2254     History Chief Complaint  Patient presents with  . Chest Pain    Brooke Perry is a 29 y.o. female who presents with shortness of breath and abdominal pain.  Patient states for the past month and a half she has had difficulty breathing and pain in her upper abdomen.  The pain has been coming and going but over the last 2 weeks has been getting worse and she is having the pain 2-3 times a day.  It hurts more when she is active and when she eats.  She states it does not matter what she eats or drinks it will cause increased pain.  Pain is a sharp sensation does not radiate.  She has not tried anything for the pain.  She denies fever, chills, cough, chest pain, nausea, vomiting, diarrhea, constipation, urinary symptoms.  She denies any prior abdominal surgeries.  She does not smoke.   HPI     Past Medical History:  Diagnosis Date  . Abnormal vaginal bleeding   . Breakthrough bleeding on Nexplanon 04/09/2014  . GERD (gastroesophageal reflux disease)     Patient Active Problem List   Diagnosis Date Noted  . Supervision of low-risk pregnancy 09/17/2015  . Enlarged thyroid gland 09/15/2015  . Language barrier, cultural differences 08/01/2012    Past Surgical History:  Procedure Laterality Date  . NO PAST SURGERIES       OB History    Gravida  2   Para  1   Term  1   Preterm      AB      Living  1     SAB      TAB      Ectopic      Multiple      Live Births  1           No family history on file.  Social History   Tobacco Use  . Smoking status: Never Smoker  . Smokeless tobacco: Never Used  Substance Use Topics  . Alcohol use: No  . Drug use: No    Home Medications Prior to Admission medications   Medication Sig Start Date End Date Taking? Authorizing Provider  Prenatal Vit-Fe Fumarate-FA (MULTIVITAMIN-PRENATAL) 27-0.8 MG  TABS tablet Take 1 tablet by mouth daily at 12 noon.    [provider]    Allergies    Patient has no known allergies.  Review of Systems   Review of Systems  Constitutional: Negative for chills and fever.  Respiratory: Positive for shortness of breath. Negative for cough and wheezing.   Cardiovascular: Negative for chest pain.  Gastrointestinal: Positive for abdominal pain. Negative for constipation, diarrhea, nausea and vomiting.  Genitourinary: Negative for dysuria.  All other systems reviewed and are negative.   Physical Exam Updated Vital Signs BP (!) 116/91 (BP Location: Right Arm)   Pulse 73   Temp 98.5 F (36.9 C) (Oral)   Resp 16   Ht 5\' 4"  (1.626 m)   Wt 58.5 kg   LMP 08/12/2019 (Exact Date)   SpO2 100%   BMI 22.14 kg/m   Physical Exam Vitals and nursing note reviewed.  Constitutional:      General: She is not in acute distress.    Appearance: Normal appearance. She is well-developed. She is not ill-appearing.  HENT:     Head: Normocephalic and atraumatic.  Eyes:  General: No scleral icterus.       Right eye: No discharge.        Left eye: No discharge.     Conjunctiva/sclera: Conjunctivae normal.     Pupils: Pupils are equal, round, and reactive to light.  Cardiovascular:     Rate and Rhythm: Normal rate and regular rhythm.  Pulmonary:     Effort: Pulmonary effort is normal. No respiratory distress.     Breath sounds: Normal breath sounds.  Abdominal:     General: There is no distension.     Palpations: Abdomen is soft.     Tenderness: There is abdominal tenderness (epigastric, moderate).  Musculoskeletal:     Cervical back: Normal range of motion.  Skin:    General: Skin is warm and dry.     Findings: Rash (diffuse, erythematous rash on the bilateral arms which pt states is from "allergies") present.  Neurological:     Mental Status: She is alert and oriented to person, place, and time.  Psychiatric:        Behavior: Behavior  normal.     ED Results / Procedures / Treatments   Labs (all labs ordered are listed, but only abnormal results are displayed) Labs Reviewed  BASIC METABOLIC PANEL - Abnormal; Notable for the following components:      Result Value   Glucose, Bld 123 (*)    All other components within normal limits  CBC - Abnormal; Notable for the following components:   WBC 15.7 (*)    Hemoglobin 10.5 (*)    HCT 35.5 (*)    MCV 74.7 (*)    MCH 22.1 (*)    MCHC 29.6 (*)    RDW 16.9 (*)    Platelets 435 (*)    All other components within normal limits  URINALYSIS, ROUTINE W REFLEX MICROSCOPIC - Abnormal; Notable for the following components:   APPearance HAZY (*)    Specific Gravity, Urine 1.033 (*)    Hgb urine dipstick LARGE (*)    Protein, ur 100 (*)    RBC / HPF >50 (*)    Crystals PRESENT (*)    All other components within normal limits  HEPATIC FUNCTION PANEL - Abnormal; Notable for the following components:   Albumin 3.3 (*)    Total Bilirubin 0.2 (*)    All other components within normal limits  LIPASE, BLOOD  I-STAT BETA HCG BLOOD, ED ([facility], [facility], [facility] ONLY)  I-STAT BETA HCG BLOOD, ED ([facility], [facility], [facility] ONLY)  TROPONIN I (HIGH SENSITIVITY)  TROPONIN I (HIGH SENSITIVITY)    EKG EKG Interpretation  Date/Time:  Wednesday August 14 2019 23:09:41 EDT Ventricular Rate:  95 PR Interval:  128 QRS Duration: 64 QT Interval:  330 QTC Calculation: 414 R Axis:   79 Text Interpretation: Normal sinus rhythm Normal ECG Since last tracing rate slower Confirmed by Jacalyn Lefevre [phone]) on 08/15/2019 7:41:40 AM   Radiology DG Chest 2 View  Result Date: 08/14/2019 CLINICAL DATA:  29 year old female with chest pain. EXAM: CHEST - 2 VIEW COMPARISON:  None. FINDINGS: The heart size and mediastinal contours are within normal limits. Both lungs are clear. The visualized skeletal structures are unremarkable. IMPRESSION: No active cardiopulmonary disease. Electronically Signed   By: Elgie Collard M.D.    On: 08/14/2019 23:39    Procedures Procedures (including critical care time)  Medications Ordered in ED Medications - No data to display  ED Course  I have reviewed the triage vital signs and the nursing  notes.  Pertinent labs & imaging results that were available during my care of the patient were reviewed by me and considered in my medical decision making (see chart for details).  29 year old female presents with epigastric pain and SOB for over a month. Her vitals are normal. Heart is regular rate and rhythm. Lungs are CTA. Abdomen is soft and she is moderately tender in the epigastric area. EKG is sinus tachycardia. Chest pain order set was used in triage which is all normal. Will add on lipase and LFTs due to upper abdominal pain and give antacids for suspected gastritis  9:19 AM Pt reports no improvement with GI cocktail and Pepcid. LFTs and lipase are normal. She is still tender in the epigastric area. Will obtain CT A/P. Also she is asking for 1-2 months off of work because she feels like she can't work with this pain.  12:22 PM CT is negative. PT re-evaluated. She feels better after morphine. Will d/c with Omeprazole, Carafate and advised GI f/u.   MDM Rules/Calculators/A&P                           Final Clinical Impression(s) / ED Diagnoses Final diagnoses:  Epigastric pain    Rx / DC Orders ED Discharge Orders    None       Bethel Born, PA-C 08/15/19 1225    Jacalyn Lefevre, MD 08/15/19 [phone]

## 2019-08-16 ENCOUNTER — Other Ambulatory Visit: Payer: Self-pay

## 2019-08-16 ENCOUNTER — Emergency Department (HOSPITAL_COMMUNITY)
Admission: EM | Admit: 2019-08-16 | Discharge: 2019-08-17 | Disposition: A | Discharge status: Home / Self Care | Payer: Self-pay | Attending: Emergency Medicine | Admitting: Emergency Medicine

## 2019-08-16 DIAGNOSIS — K219 Gastro-esophageal reflux disease without esophagitis: Secondary | ICD-10-CM | POA: Insufficient documentation

## 2019-08-16 DIAGNOSIS — R1011 Right upper quadrant pain: Secondary | ICD-10-CM | POA: Insufficient documentation

## 2019-08-16 DIAGNOSIS — R1013 Epigastric pain: Secondary | ICD-10-CM | POA: Insufficient documentation

## 2019-08-16 LAB — CBC
HCT: 33.6 % — ABNORMAL LOW (ref 36.0–46.0)
Hemoglobin: 9.9 g/dL — ABNORMAL LOW (ref 12.0–15.0)
MCH: 22 pg — ABNORMAL LOW (ref 26.0–34.0)
MCHC: 29.5 g/dL — ABNORMAL LOW (ref 30.0–36.0)
MCV: 74.8 fL — ABNORMAL LOW (ref 80.0–100.0)
Platelets: 404 10*3/uL — ABNORMAL HIGH (ref 150–400)
RBC: 4.49 MIL/uL (ref 3.87–5.11)
RDW: 17 % — ABNORMAL HIGH (ref 11.5–15.5)
WBC: 13.5 10*3/uL — ABNORMAL HIGH (ref 4.0–10.5)
nRBC: 0 % (ref 0.0–0.2)

## 2019-08-16 NOTE — ED Triage Notes (Signed)
Pt was here 2 days ago and was here for upper gastric pain. Today patient is saying the pain is still there and is getting worse with eating and drinking. Pt said it makes her more nauseated. Pt said medication that was given is not working.

## 2019-08-17 ENCOUNTER — Emergency Department (HOSPITAL_COMMUNITY): Payer: Self-pay

## 2019-08-17 LAB — COMPREHENSIVE METABOLIC PANEL
ALT: 22 U/L (ref 0–44)
AST: 20 U/L (ref 15–41)
Albumin: 3.3 g/dL — ABNORMAL LOW (ref 3.5–5.0)
Alkaline Phosphatase: 46 U/L (ref 38–126)
Anion gap: 8 (ref 5–15)
BUN: 13 mg/dL (ref 6–20)
CO2: 23 mmol/L (ref 22–32)
Calcium: 9 mg/dL (ref 8.9–10.3)
Chloride: 107 mmol/L (ref 98–111)
Creatinine, Ser: 0.75 mg/dL (ref 0.44–1.00)
GFR calc Af Amer: >60 mL/min (ref >60)
GFR calc non Af Amer: >60 mL/min (ref >60)
Glucose, Bld: 141 mg/dL — ABNORMAL HIGH (ref 70–99)
Potassium: 3.3 mmol/L — ABNORMAL LOW (ref 3.5–5.1)
Sodium: 138 mmol/L (ref 135–145)
Total Bilirubin: 0.4 mg/dL (ref 0.3–1.2)
Total Protein: 7 g/dL (ref 6.5–8.1)

## 2019-08-17 LAB — URINALYSIS, ROUTINE W REFLEX MICROSCOPIC
Bacteria, UA: NONE SEEN
Bilirubin Urine: NEGATIVE
Glucose, UA: NEGATIVE mg/dL
Ketones, ur: NEGATIVE mg/dL
Leukocytes,Ua: NEGATIVE
Nitrite: NEGATIVE
Protein, ur: 30 mg/dL — AB
RBC / HPF: >50 RBC/hpf — ABNORMAL HIGH (ref 0–5)
Specific Gravity, Urine: 1.039 — ABNORMAL HIGH (ref 1.005–1.030)
pH: 5 (ref 5.0–8.0)

## 2019-08-17 LAB — LIPASE, BLOOD: Lipase: 31 U/L (ref 11–51)

## 2019-08-17 LAB — I-STAT BETA HCG BLOOD, ED (MC, WL, AP ONLY): I-stat hCG, quantitative: <5 m[IU]/mL (ref <5)

## 2019-08-17 MED ORDER — ONDANSETRON 4 MG PO TBDP
8.0000 mg | ORAL_TABLET | Freq: Once | ORAL | Status: AC
  Administered 2019-08-17: 8 mg via ORAL
  Filled 2019-08-17: qty 2

## 2019-08-17 MED ORDER — FAMOTIDINE 20 MG PO TABS
40.0000 mg | ORAL_TABLET | Freq: Once | ORAL | Status: AC
  Administered 2019-08-17: 40 mg via ORAL
  Filled 2019-08-17: qty 2

## 2019-08-17 MED ORDER — HYDROCODONE-ACETAMINOPHEN 5-325 MG PO TABS
1.0000 | ORAL_TABLET | Freq: Once | ORAL | Status: AC
  Administered 2019-08-17: 1 via ORAL
  Filled 2019-08-17: qty 1

## 2019-08-17 MED ORDER — TRAMADOL HCL 50 MG PO TABS: 50.0000 mg | ORAL_TABLET | Freq: Four times a day (QID) | ORAL | 0 refills | Status: DC | PRN

## 2019-08-17 MED ORDER — ONDANSETRON 4 MG PO TBDP
4.0000 mg | ORAL_TABLET | Freq: Three times a day (TID) | ORAL | 0 refills | Status: AC | PRN
Start: 2019-08-17 — End: ?

## 2019-08-17 NOTE — ED Notes (Signed)
Wrong documentation at 908.

## 2019-08-17 NOTE — ED Notes (Signed)
Patient transported to Ultrasound 

## 2019-08-17 NOTE — Discharge Instructions (Addendum)
Please follow up with gastroenterology - please make an appointment with either [facility] or [facility] Increase your Omeprazole dose to 40mg  once a day Take Pepcid twice a day Take Tramadol as needed for severe pain Take Zofran as needed for nausea

## 2019-08-17 NOTE — ED Notes (Signed)
Pt is eating and drinking at this time. 

## 2019-08-17 NOTE — ED Notes (Signed)
Pt given Coke and crackers.

## 2019-08-17 NOTE — ED Provider Notes (Signed)
[facility] EMERGENCY DEPARTMENT Provider Note   CSN: [id number] Arrival date & time: 08/16/19  2310     History Chief Complaint  Patient presents with   Abdominal Pain    Brooke Perry is a 29 y.o. female with hx of GERD who presents with ongoing abdominal pain. She was here 2 days ago and seen by me. History and exam were consistent with gastritis vs PUD. Her labs and imaging were all negative. She improved after morphine. She was discharged with Omeprazole and Carafate. She's been taking it as prescribed however not had any relief. Yesterday she ate a cookie but then threw it up. She has not been drinking any fluids. The pain feels like a burning and occurs every 30-6 minutes. It is non-radiating. There are no fever, chills, diarrhea/consitpation, urinary symptoms, vaginal discharge. She is on her period currently.    HPI     Past Medical History:  Diagnosis Date   Abnormal vaginal bleeding    Breakthrough bleeding on Nexplanon 04/09/2014   GERD (gastroesophageal reflux disease)     Patient Active Problem List   Diagnosis Date Noted   Supervision of low-risk pregnancy 09/17/2015   Enlarged thyroid gland 09/15/2015   Language barrier, cultural differences 08/01/2012    Past Surgical History:  Procedure Laterality Date   NO PAST SURGERIES       OB History    Gravida  2   Para  1   Term  1   Preterm      AB      Living  1     SAB      TAB      Ectopic      Multiple      Live Births  1           No family history on file.  Social History   Tobacco Use   Smoking status: Never Smoker   Smokeless tobacco: Never Used  Substance Use Topics   Alcohol use: No   Drug use: No    Home Medications Prior to Admission medications   Medication Sig Start Date End Date Taking? Authorizing Provider  omeprazole (PRILOSEC) 20 MG capsule Take 1 capsule (20 mg total) by mouth daily. 08/15/19   Bethel Born, PA-C    sucralfate (CARAFATE) 1 g tablet Take 1 tablet (1 g total) by mouth 4 (four) times daily -  with meals and at bedtime. 08/15/19   Bethel Born, PA-C    Allergies    Patient has no known allergies.  Review of Systems   Review of Systems  Constitutional: Negative for chills and fever.  Respiratory: Negative for shortness of breath.   Cardiovascular: Negative for chest pain.  Gastrointestinal: Positive for abdominal pain, nausea and vomiting. Negative for constipation and diarrhea.  Genitourinary: Negative for dysuria and menstrual problem.  All other systems reviewed and are negative.   Physical Exam Updated Vital Signs BP 106/73 (BP Location: Left Arm)    Pulse 81    Temp 98.9 F (37.2 C) (Oral)    Resp 16    LMP 08/12/2019 (Exact Date)    SpO2 100%   Physical Exam Vitals and nursing note reviewed.  Constitutional:      General: She is not in acute distress.    Appearance: She is well-developed. She is not ill-appearing.  HENT:     Head: Normocephalic and atraumatic.  Eyes:     General: No scleral icterus.  Right eye: No discharge.        Left eye: No discharge.     Conjunctiva/sclera: Conjunctivae normal.     Pupils: Pupils are equal, round, and reactive to light.  Cardiovascular:     Rate and Rhythm: Normal rate.  Pulmonary:     Effort: Pulmonary effort is normal. No respiratory distress.  Abdominal:     General: Abdomen is flat. Bowel sounds are normal. There is no distension.     Palpations: Abdomen is soft.     Tenderness: There is abdominal tenderness in the right upper quadrant and epigastric area.     Hernia: No hernia is present.  Musculoskeletal:     Cervical back: Normal range of motion.  Skin:    General: Skin is warm and dry.  Neurological:     Mental Status: She is alert and oriented to person, place, and time.  Psychiatric:        Behavior: Behavior normal.     ED Results / Procedures / Treatments   Labs (all labs ordered are listed,  but only abnormal results are displayed) Labs Reviewed  COMPREHENSIVE METABOLIC PANEL - Abnormal; Notable for the following components:      Result Value   Potassium 3.3 (*)    Glucose, Bld 141 (*)    Albumin 3.3 (*)    All other components within normal limits  CBC - Abnormal; Notable for the following components:   WBC 13.5 (*)    Hemoglobin 9.9 (*)    HCT 33.6 (*)    MCV 74.8 (*)    MCH 22.0 (*)    MCHC 29.5 (*)    RDW 17.0 (*)    Platelets 404 (*)    All other components within normal limits  URINALYSIS, ROUTINE W REFLEX MICROSCOPIC - Abnormal; Notable for the following components:   Color, Urine AMBER (*)    APPearance CLOUDY (*)    Specific Gravity, Urine 1.039 (*)    Hgb urine dipstick LARGE (*)    Protein, ur 30 (*)    RBC / HPF >50 (*)    All other components within normal limits  LIPASE, BLOOD  I-STAT BETA HCG BLOOD, ED ([facility], [facility], [facility] ONLY)    EKG None  Radiology US Abdomen Limited RUQ  Result Date: 08/17/2019 CLINICAL DATA:  Right upper quadrant pain for several weeks EXAM: ULTRASOUND ABDOMEN LIMITED RIGHT UPPER QUADRANT COMPARISON:  CT from 2 days previous. FINDINGS: Gallbladder: No gallstones or wall thickening visualized. No sonographic Murphy sign noted by sonographer. Common bile duct: Diameter: 0.8 mm Liver: No focal lesion identified. Within normal limits in parenchymal echogenicity. Portal vein is patent on color Doppler imaging with normal direction of blood flow towards the liver. Other: None. IMPRESSION: No acute abnormality noted. Electronically Signed   By: Alcide Clever M.D.   On: 08/17/2019 10:55    Procedures Procedures (including critical care time)  Medications Ordered in ED Medications  HYDROcodone-acetaminophen (NORCO/VICODIN) 5-325 MG per tablet 1 tablet (1 tablet Oral Given 08/17/19 0900)  famotidine (PEPCID) tablet 40 mg (40 mg Oral Given 08/17/19 0900)  ondansetron (ZOFRAN-ODT) disintegrating tablet 8 mg (8 mg Oral Given 08/17/19 0900)     ED Course  I have reviewed the triage vital signs and the nursing notes.  Pertinent labs & imaging results that were available during my care of the patient were reviewed by me and considered in my medical decision making (see chart for details).  Clinical Course as of Aug 18 910  Sat Aug 17, 2019  0816 Color, Urine(!): AMBER [AS]    Clinical Course User Index [AS] Sagun, Amelia, Student-PA   29 year old female presents with ongoing upper abdominal pain which is chronic at this point. Her vitals are normal. She complains of severe pain but appears calm and comfortable. She is tender on exam - mostly in the epigastrium and some in the RUQ as well. She had a reassuring work up 2 days ago. Repeat labs and UA look similar. Will add on RUQ [place] and try different medications.  [place] is negative. She feels no better after meds here and continue to report severe pain but also continues to look comfortable. She was given Omeprazole and Carafate at her last visit. She states the Carafate actually makes her pain worse. Will increase her dose of Omeprazole, stop the Carafate, and prescribe short course of pain meds and Zofran. There is not much more we can offer her in the ED. She has tolerated PO. I contacted [facility] and submitted a referral to see if we can get her in to be seen sooner because she likely needs an endoscopy to r/o PUD.   MDM Rules/Calculators/A&P                           Final Clinical Impression(s) / ED Diagnoses Final diagnoses:  RUQ pain  Epigastric pain    Rx / DC Orders ED Discharge Orders    None       Bethel Born, PA-C 08/18/19 4098    Geoffery Lyons, MD 08/18/19 [phone]

## 2019-08-19 ENCOUNTER — Telehealth: Payer: Self-pay

## 2019-08-19 NOTE — Telephone Encounter (Signed)
-----   Message from Lemar Lofty., MD sent at 08/17/2019  8:51 PM EDT ----- Harriet Butte am sorry that this patient continues to have issues.Cannot guarantee that there is a clinic availability this week for the patient.However, ensuring that if you feel GI follow-up or referral is needed and you place a referral this will help with trying to expedite things because that will be seen by our office to try and make follow-up appointments or reach out to the patient's.I will have my nurse send this patient's name to our front desk staff and in order of trying to see if patient may be able to see a provider this week versus in the next few weeks.Marika Mahaffy, please forward to the front desk and ask if they can move forward with trying to reach the patient per the ED referral.Thanks.GM ----- Message ----- From: Beryle Quant Sent: 08/17/2019   8:35 AM EDT To: Lemar Lofty., MD  Hello,  I have a patient that's been in the ED twice for the same problem. I have a high suspicion for gastritis vs PUD. She has had a reassuring work up both times and wanted to see if there is any way we can get her in to the clinic this week since there is not much else we can do for her here. Thank you for any help!  -Tresa Endo

## 2019-08-23 ENCOUNTER — Encounter: Payer: Self-pay | Admitting: Gastroenterology

## 2019-09-18 NOTE — Telephone Encounter (Signed)
Appointment scheduled 10-24-19. On wait list for sooner appointment.

## 2019-10-24 ENCOUNTER — Ambulatory Visit: Payer: Self-pay | Admitting: Gastroenterology

## 2021-02-14 ENCOUNTER — Ambulatory Visit (INDEPENDENT_AMBULATORY_CARE_PROVIDER_SITE_OTHER): Payer: Self-pay

## 2021-02-14 ENCOUNTER — Encounter (HOSPITAL_COMMUNITY): Payer: Self-pay | Admitting: Emergency Medicine

## 2021-02-14 ENCOUNTER — Ambulatory Visit (HOSPITAL_COMMUNITY)
Admission: EM | Admit: 2021-02-14 | Discharge: 2021-02-14 | Disposition: A | Discharge status: Home / Self Care | Payer: Self-pay | Attending: Physician Assistant | Admitting: Physician Assistant

## 2021-02-14 ENCOUNTER — Other Ambulatory Visit: Payer: Self-pay

## 2021-02-14 DIAGNOSIS — M25562 Pain in left knee: Secondary | ICD-10-CM

## 2021-02-14 DIAGNOSIS — W19XXXA Unspecified fall, initial encounter: Secondary | ICD-10-CM

## 2021-02-14 DIAGNOSIS — M25572 Pain in left ankle and joints of left foot: Secondary | ICD-10-CM

## 2021-02-14 MED ORDER — DICLOFENAC SODIUM 50 MG PO TBEC: 50.0000 mg | DELAYED_RELEASE_TABLET | Freq: Two times a day (BID) | ORAL | 0 refills | Status: AC

## 2021-02-14 NOTE — ED Provider Notes (Signed)
[facility]    CSN: [id number] Arrival date & time: 02/14/21  1304      History   Chief Complaint Chief Complaint  Patient presents with   Fall    HPI Brooke Perry is a 31 y.o. female.   Patient presents today with a several day history of worsening left knee and ankle pain following injury.  Reports that she was taking out the garbage when she slipped and fell forcefully flexing her knee and ankle.  Since that time she has had ongoing pain that is gradually worsened.  Pain is currently rated 8 on a 0-10 pain scale, described as throbbing, localized to anterior right knee with radiation into lower leg and ankle, worse with palpation or attempted ambulation, no alleviating factors and affect.  She denies previous injury or surgery involving knee or ankle joints.  She has tried Tylenol without improvement of symptoms.  Denies any significant swelling, numbness, paresthesias.  She had to call out of work as result of symptoms.  She is confident that she is not pregnant.   Past Medical History:  Diagnosis Date   Abnormal vaginal bleeding    Breakthrough bleeding on Nexplanon 04/09/2014   GERD (gastroesophageal reflux disease)     Patient Active Problem List   Diagnosis Date Noted   Supervision of low-risk pregnancy 09/17/2015   Enlarged thyroid gland 09/15/2015   Language barrier, cultural differences 08/01/2012    Past Surgical History:  Procedure Laterality Date   NO PAST SURGERIES      OB History     Gravida  2   Para  1   Term  1   Preterm      AB      Living  1      SAB      IAB      Ectopic      Multiple      Live Births  1            Home Medications    Prior to Admission medications   Medication Sig Start Date End Date Taking? Authorizing Provider  diclofenac (VOLTAREN) 50 MG EC tablet Take 1 tablet (50 mg total) by mouth 2 (two) times daily. 02/14/21  Yes Izzie Geers, Noberto Retort, PA-C  omeprazole (PRILOSEC) 20 MG capsule Take 1 capsule  (20 mg total) by mouth daily. Patient not taking: Reported on 02/14/2021 08/15/19   Bethel Born, PA-C  ondansetron (ZOFRAN ODT) 4 MG disintegrating tablet Take 1 tablet (4 mg total) by mouth every 8 (eight) hours as needed for nausea or vomiting. Patient not taking: Reported on 02/14/2021 08/17/19   Bethel Born, PA-C  sucralfate (CARAFATE) 1 g tablet Take 1 tablet (1 g total) by mouth 4 (four) times daily -  with meals and at bedtime. Patient not taking: Reported on 02/14/2021 08/15/19   Bethel Born, PA-C    Family History History reviewed. No pertinent family history.  Social History Social History   Tobacco Use   Smoking status: Never   Smokeless tobacco: Never  Vaping Use   Vaping Use: Never used  Substance Use Topics   Alcohol use: No   Drug use: No     Allergies   Patient has no known allergies.   Review of Systems Review of Systems  Constitutional:  Positive for activity change. Negative for appetite change, fatigue and fever.  Respiratory:  Negative for cough and shortness of breath.   Cardiovascular:  Negative for chest pain.  Gastrointestinal:  Negative for abdominal pain, diarrhea, nausea and vomiting.  Musculoskeletal:  Positive for arthralgias, gait problem, joint swelling and myalgias.  Neurological:  Negative for dizziness, light-headedness and headaches.    Physical Exam Triage Vital Signs ED Triage Vitals  Enc Vitals Group     BP 02/14/21 1500 (!) 142/73     Pulse Rate 02/14/21 1500 (!) 110     Resp 02/14/21 1500 18     Temp 02/14/21 1500 98.4 F (36.9 C)     Temp Source 02/14/21 1500 Oral     SpO2 02/14/21 1500 100 %     Weight --      Height --      Head Circumference --      Peak Flow --      Pain Score 02/14/21 1458 8     Pain Loc --      Pain Edu? --      Excl. in GC? --    No data found.  Updated Vital Signs BP (!) 142/73 (BP Location: Right Arm)    Pulse (!) 110    Temp 98.4 F (36.9 C) (Oral)    Resp 18    LMP  02/07/2021 (Approximate)    SpO2 100%   Visual Acuity Right Eye Distance:   Left Eye Distance:   Bilateral Distance:    Right Eye Near:   Left Eye Near:    Bilateral Near:     Physical Exam Vitals reviewed.  Constitutional:      General: She is awake. She is not in acute distress.    Appearance: Normal appearance. She is well-developed. She is not ill-appearing.     Comments: Very pleasant female appears stated age in no acute distress sitting comfortably in exam room  HENT:     Head: Normocephalic and atraumatic.  Cardiovascular:     Rate and Rhythm: Normal rate and regular rhythm.     Heart sounds: Normal heart sounds, S1 normal and S2 normal. No murmur heard. Pulmonary:     Effort: Pulmonary effort is normal.     Breath sounds: Normal breath sounds. No wheezing, rhonchi or rales.     Comments: Clear to auscultation bilaterally Abdominal:     Palpations: Abdomen is soft.     Tenderness: There is no abdominal tenderness.  Musculoskeletal:     Left knee: No swelling or deformity. Decreased range of motion. Tenderness present over the medial joint line. No LCL laxity, MCL laxity, ACL laxity or PCL laxity.    Right lower leg: No edema.     Left lower leg: No edema.     Left ankle: No swelling. Tenderness present over the lateral malleolus and medial malleolus. Decreased range of motion.     Comments: Left knee: Tenderness palpation over patella and medial joint line.  No ligamentous laxity on exam.  No deformity or effusion noted.  Left ankle: Significant tenderness palpation over medial and lateral malleolus.  No deformity noted.  Normal plantar and dorsiflexion; pain with inversion and eversion.  Psychiatric:        Behavior: Behavior is cooperative.     UC Treatments / Results  Labs (all labs ordered are listed, but only abnormal results are displayed) Labs Reviewed - No data to display  EKG   Radiology DG Ankle Complete Left  Result Date: 02/14/2021 CLINICAL  DATA:  Patient fell on Thursday. Patient was outside and landed with left leg bent and behind her. Pain in left knee mainly, some  soreness in left ankle patient states both knee and ankle hurt most on the lateral side EXAM: LEFT ANKLE COMPLETE - 3+ VIEW COMPARISON:  None. FINDINGS: No fracture or bone lesion. Ankle joint normally spaced and aligned. Soft tissue swelling, most evident laterally. IMPRESSION: 1. No fracture or joint abnormality. 2. Soft tissue swelling. Electronically Signed   By: Amie Portland M.D.   On: 02/14/2021 15:40   DG Knee Complete 4 Views Left  Result Date: 02/14/2021 CLINICAL DATA:  Patient fell on Thursday. Patient was outside and landed with left leg bent and behind her. Pain in left knee mainly, some soreness in left ankle patient states both knee and ankle hurt most on the lateral side EXAM: LEFT KNEE - COMPLETE 4+ VIEW COMPARISON:  None. FINDINGS: No evidence of fracture, dislocation, or joint effusion. No evidence of arthropathy or other focal bone abnormality. Soft tissues are unremarkable. IMPRESSION: Negative. Electronically Signed   By: Amie Portland M.D.   On: 02/14/2021 15:39    Procedures Procedures (including critical care time)  Medications Ordered in UC Medications - No data to display  Initial Impression / Assessment and Plan / UC Course  I have reviewed the triage vital signs and the nursing notes.  Pertinent labs & imaging results that were available during my care of the patient were reviewed by me and considered in my medical decision making (see chart for details).     X-ray of left ankle and left knee obtained given recent trauma with associated bony tenderness showed no osseous abnormality.  Discussed contusion likely etiology of symptoms.  Patient was encouraged to use conservative treatment measures including RICE protocol.  We will start diclofenac for pain and she was instructed not to take additional NSAIDs with this medication due to risk of  GI bleeding.  She is to follow-up with orthopedics if symptoms or not improving quickly with conservative treatment measures and was given contact information for local provider.  Discussed alarm symptoms that warrant emergent evaluation including severe pain, weakness, numbness/paresthesias, difficulty ambulating or bearing weight.  Strict return precautions given to which she expressed understanding.  Work excuse note provided.  Final Clinical Impressions(s) / UC Diagnoses   Final diagnoses:  Fall, initial encounter  Acute pain of left knee  Acute left ankle pain     Discharge Instructions      Your x-rays were normal with no evidence of fracture or dislocation.  Keep your leg elevated and use ice on your knee and ankle.  Use an Ace bandage to provide some compression.  Take diclofenac for pain.  You should not take additional NSAIDs including aspirin, ibuprofen/Advil, naproxen/Aleve with this medication as it can cause stomach bleeding.  You can use Tylenol for breakthrough pain.  If your symptoms or not improving quickly please follow-up with orthopedics.  If you have any worsening symptoms including severe pain, numbness/tingling, difficulty bearing weight you should be seen immediately.     ED Prescriptions     Medication Sig Dispense Auth. Provider   diclofenac (VOLTAREN) 50 MG EC tablet Take 1 tablet (50 mg total) by mouth 2 (two) times daily. 20 tablet Abrahim Sargent, Noberto Retort, PA-C      PDMP not reviewed this encounter.   Jeani Hawking, PA-C 02/14/21 1551

## 2021-02-14 NOTE — Discharge Instructions (Signed)
Your x-rays were normal with no evidence of fracture or dislocation.  Keep your leg elevated and use ice on your knee and ankle.  Use an Ace bandage to provide some compression.  Take diclofenac for pain.  You should not take additional NSAIDs including aspirin, ibuprofen/Advil, naproxen/Aleve with this medication as it can cause stomach bleeding.  You can use Tylenol for breakthrough pain.  If your symptoms or not improving quickly please follow-up with orthopedics.  If you have any worsening symptoms including severe pain, numbness/tingling, difficulty bearing weight you should be seen immediately.

## 2021-02-14 NOTE — ED Triage Notes (Addendum)
Patient fell on Thursday.  Patient was outside and landed with left leg bent and behind her.  Pain in left knee mainly, some soreness in left ankle

## 2022-05-05 IMAGING — DX DG KNEE COMPLETE 4+V*L*
4 series · 4 of 4 positions shown · non-contrast
Comparison: None.

CLINICAL DATA: Patient fell on [REDACTED]. Patient was outside and
landed with left leg bent and behind her. Pain in left knee mainly,
some soreness in left ankle patient states both knee and ankle hurt
most on the lateral side

EXAM:
LEFT KNEE - COMPLETE 4+ VIEW

[knee ap]
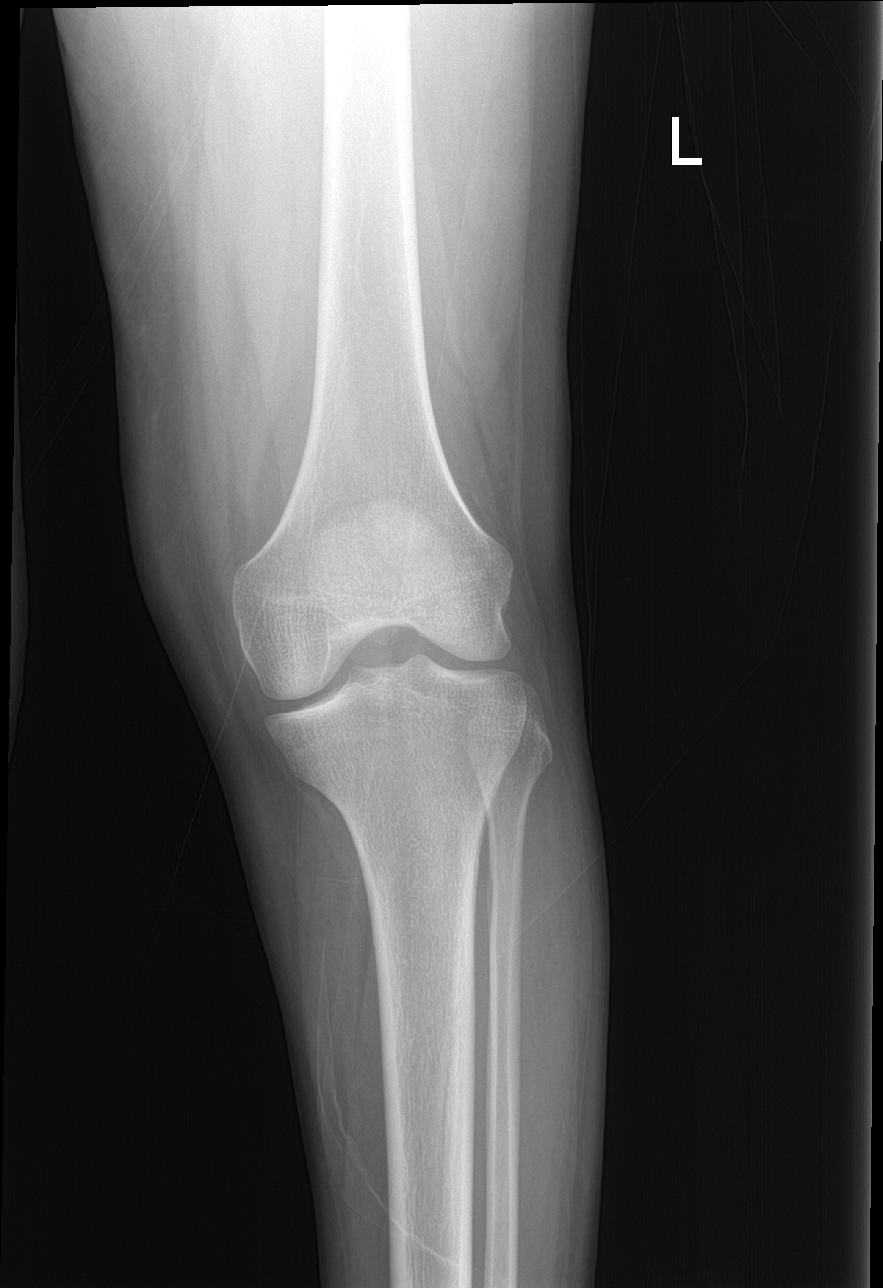

[knee obl (1 of 2)]
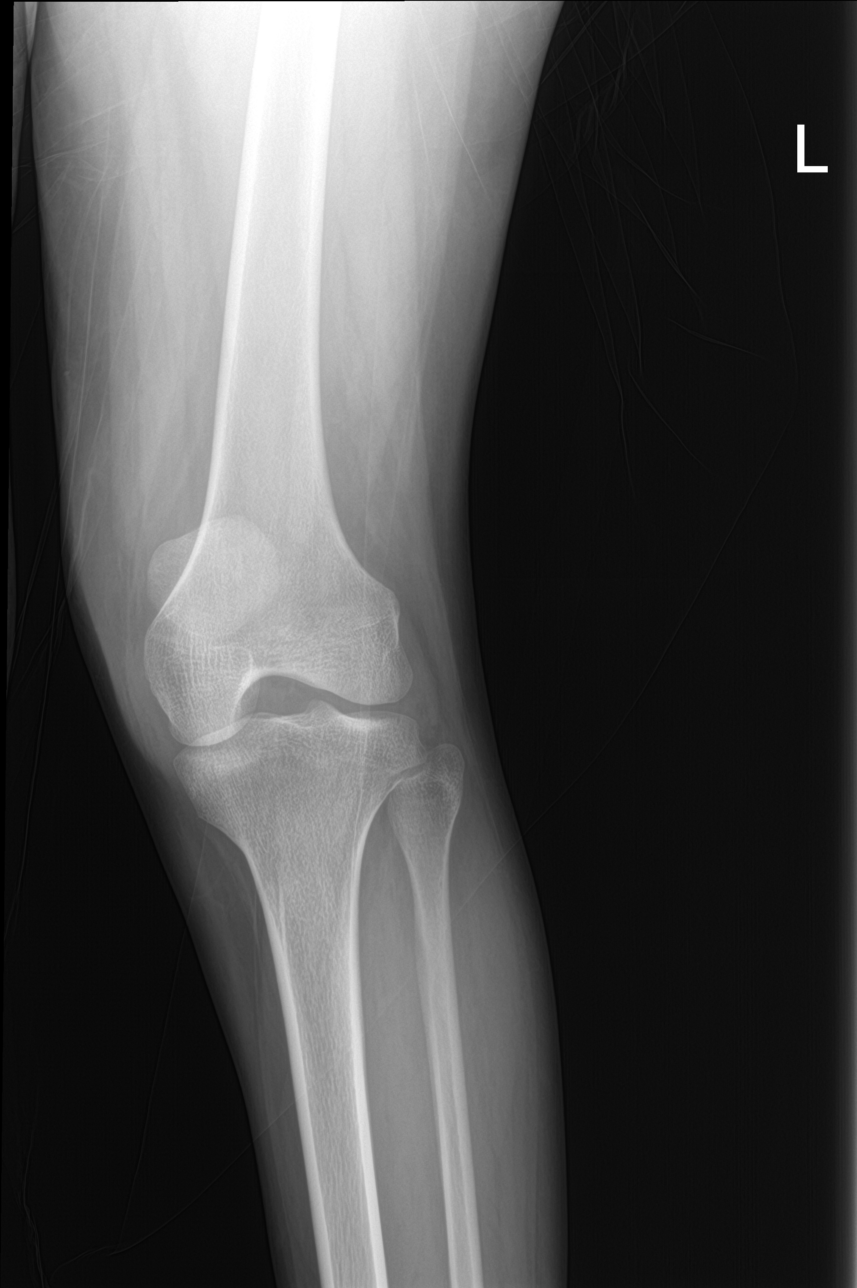

[knee obl (2 of 2)]
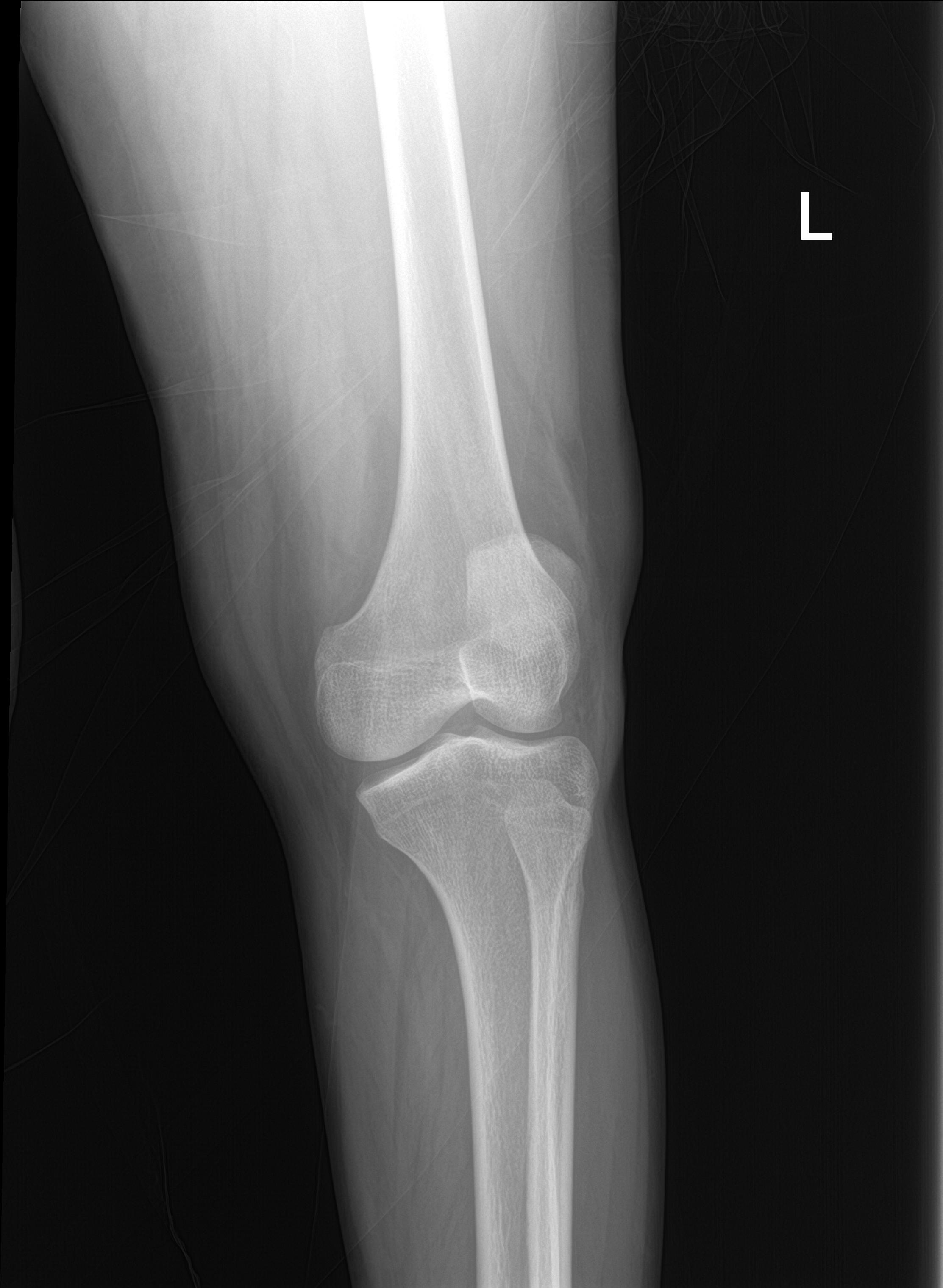

[knee lat]
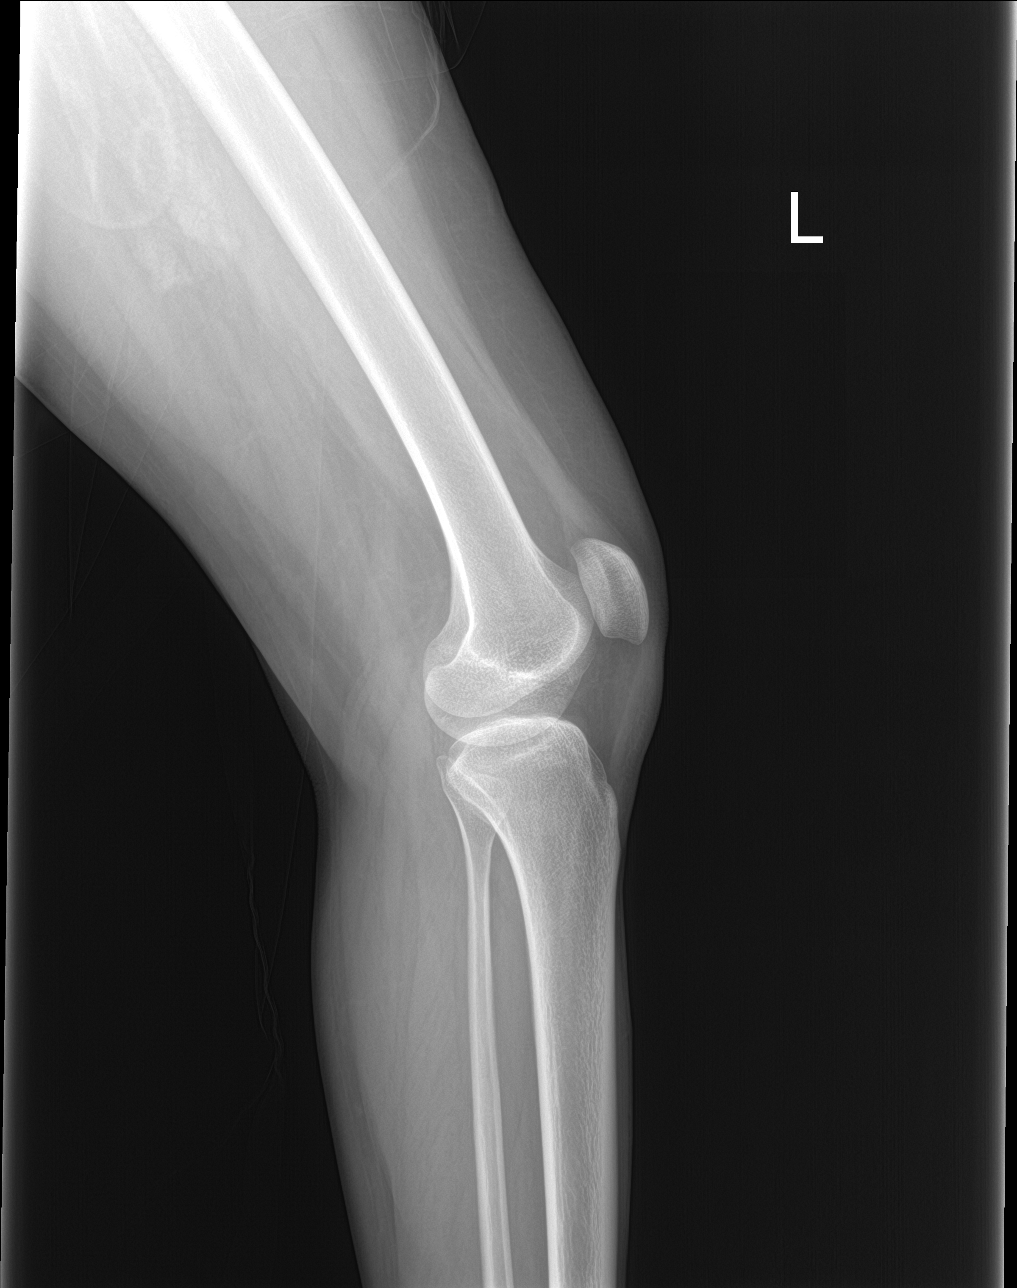

[4 of 4 positions shown; findings below may reference images not displayed]

FINDINGS: No evidence of fracture, dislocation, or joint effusion. No evidence
of arthropathy or other focal bone abnormality. Soft tissues are
unremarkable.
IMPRESSION: Negative.
